# Patient Record
Sex: Female | Born: 1970 | Race: White | Hispanic: No | Smoking: Current every day smoker
Health system: Southern US, Community
[De-identification: ages and names within clinical notes are randomized; demographics above are authoritative.]

## PROBLEM LIST (undated history)

## (undated) DIAGNOSIS — E119 Type 2 diabetes mellitus without complications: Secondary | ICD-10-CM

## (undated) DIAGNOSIS — J45909 Unspecified asthma, uncomplicated: Secondary | ICD-10-CM

## (undated) DIAGNOSIS — J449 Chronic obstructive pulmonary disease, unspecified: Secondary | ICD-10-CM

## (undated) DIAGNOSIS — R7303 Prediabetes: Secondary | ICD-10-CM

## (undated) DIAGNOSIS — F32A Depression, unspecified: Secondary | ICD-10-CM

## (undated) HISTORY — DX: Chronic obstructive pulmonary disease, unspecified: J44.9

## (undated) HISTORY — DX: Type 2 diabetes mellitus without complications: E11.9

## (undated) HISTORY — DX: Unspecified asthma, uncomplicated: J45.909

## (undated) HISTORY — PX: HERNIA REPAIR: SHX51

## (undated) HISTORY — PX: CHOLECYSTECTOMY: SHX55

---

## 1998-09-25 ENCOUNTER — Emergency Department (HOSPITAL_COMMUNITY): Admission: EM | Admit: 1998-09-25 | Discharge: 1998-09-25 | Payer: Self-pay | Admitting: Emergency Medicine

## 1999-01-22 ENCOUNTER — Other Ambulatory Visit: Admission: RE | Admit: 1999-01-22 | Discharge: 1999-01-22 | Payer: Self-pay | Admitting: Obstetrics

## 1999-01-22 ENCOUNTER — Encounter (HOSPITAL_COMMUNITY): Admission: RE | Admit: 1999-01-22 | Discharge: 1999-04-22 | Payer: Self-pay | Admitting: Obstetrics

## 1999-01-23 ENCOUNTER — Other Ambulatory Visit: Admission: RE | Admit: 1999-01-23 | Discharge: 1999-01-23 | Payer: Self-pay | Admitting: Obstetrics

## 1999-01-23 ENCOUNTER — Encounter (INDEPENDENT_AMBULATORY_CARE_PROVIDER_SITE_OTHER): Payer: Self-pay

## 1999-04-29 ENCOUNTER — Inpatient Hospital Stay (HOSPITAL_COMMUNITY): Admission: AD | Admit: 1999-04-29 | Discharge: 1999-04-29 | Payer: Self-pay | Admitting: Obstetrics

## 1999-05-02 ENCOUNTER — Emergency Department (HOSPITAL_COMMUNITY): Admission: EM | Admit: 1999-05-02 | Discharge: 1999-05-02 | Payer: Self-pay | Admitting: Internal Medicine

## 1999-06-25 ENCOUNTER — Inpatient Hospital Stay (HOSPITAL_COMMUNITY): Admission: AD | Admit: 1999-06-25 | Discharge: 1999-06-25 | Payer: Self-pay | Admitting: Obstetrics

## 2000-08-23 ENCOUNTER — Emergency Department (HOSPITAL_COMMUNITY): Admission: EM | Admit: 2000-08-23 | Discharge: 2000-08-23 | Payer: Self-pay | Admitting: Emergency Medicine

## 2001-04-09 ENCOUNTER — Emergency Department (HOSPITAL_COMMUNITY): Admission: EM | Admit: 2001-04-09 | Discharge: 2001-04-09 | Payer: Self-pay | Admitting: Emergency Medicine

## 2001-11-15 ENCOUNTER — Encounter: Payer: Self-pay | Admitting: Emergency Medicine

## 2001-11-15 ENCOUNTER — Emergency Department (HOSPITAL_COMMUNITY): Admission: EM | Admit: 2001-11-15 | Discharge: 2001-11-15 | Payer: Self-pay | Admitting: Emergency Medicine

## 2002-03-01 ENCOUNTER — Other Ambulatory Visit: Admission: RE | Admit: 2002-03-01 | Discharge: 2002-03-01 | Payer: Self-pay | Admitting: Obstetrics and Gynecology

## 2003-06-07 ENCOUNTER — Encounter: Admission: RE | Admit: 2003-06-07 | Discharge: 2003-06-07 | Payer: Self-pay | Admitting: Otolaryngology

## 2003-07-23 ENCOUNTER — Emergency Department (HOSPITAL_COMMUNITY): Admission: EM | Admit: 2003-07-23 | Discharge: 2003-07-23 | Payer: Self-pay | Admitting: Emergency Medicine

## 2003-09-10 ENCOUNTER — Ambulatory Visit (HOSPITAL_COMMUNITY): Admission: RE | Admit: 2003-09-10 | Discharge: 2003-09-10 | Payer: Self-pay | Admitting: Otolaryngology

## 2003-09-10 ENCOUNTER — Ambulatory Visit (HOSPITAL_BASED_OUTPATIENT_CLINIC_OR_DEPARTMENT_OTHER): Admission: RE | Admit: 2003-09-10 | Discharge: 2003-09-10 | Payer: Self-pay | Admitting: Otolaryngology

## 2003-09-10 ENCOUNTER — Encounter (INDEPENDENT_AMBULATORY_CARE_PROVIDER_SITE_OTHER): Payer: Self-pay | Admitting: *Deleted

## 2004-06-15 ENCOUNTER — Emergency Department (HOSPITAL_COMMUNITY): Admission: EM | Admit: 2004-06-15 | Discharge: 2004-06-16 | Payer: Self-pay | Admitting: Emergency Medicine

## 2004-07-17 ENCOUNTER — Emergency Department (HOSPITAL_COMMUNITY): Admission: EM | Admit: 2004-07-17 | Discharge: 2004-07-17 | Payer: Self-pay | Admitting: Emergency Medicine

## 2005-12-28 ENCOUNTER — Emergency Department (HOSPITAL_COMMUNITY): Admission: EM | Admit: 2005-12-28 | Discharge: 2005-12-28 | Payer: Self-pay | Admitting: Emergency Medicine

## 2006-06-17 ENCOUNTER — Emergency Department (HOSPITAL_COMMUNITY): Admission: EM | Admit: 2006-06-17 | Discharge: 2006-06-18 | Payer: Self-pay | Admitting: Emergency Medicine

## 2007-07-26 ENCOUNTER — Emergency Department (HOSPITAL_COMMUNITY): Admission: EM | Admit: 2007-07-26 | Discharge: 2007-07-26 | Payer: Self-pay | Admitting: Emergency Medicine

## 2007-07-27 ENCOUNTER — Emergency Department (HOSPITAL_COMMUNITY): Admission: EM | Admit: 2007-07-27 | Discharge: 2007-07-27 | Payer: Self-pay | Admitting: Family Medicine

## 2008-01-19 ENCOUNTER — Emergency Department (HOSPITAL_COMMUNITY): Admission: EM | Admit: 2008-01-19 | Discharge: 2008-01-19 | Payer: Self-pay | Admitting: Emergency Medicine

## 2008-03-25 ENCOUNTER — Emergency Department (HOSPITAL_COMMUNITY): Admission: EM | Admit: 2008-03-25 | Discharge: 2008-03-25 | Payer: Self-pay | Admitting: Emergency Medicine

## 2008-04-03 ENCOUNTER — Emergency Department (HOSPITAL_COMMUNITY): Admission: EM | Admit: 2008-04-03 | Discharge: 2008-04-03 | Payer: Self-pay | Admitting: Family Medicine

## 2009-01-01 ENCOUNTER — Emergency Department (HOSPITAL_COMMUNITY): Admission: EM | Admit: 2009-01-01 | Discharge: 2009-01-01 | Payer: Self-pay | Admitting: Emergency Medicine

## 2009-10-05 ENCOUNTER — Emergency Department (HOSPITAL_COMMUNITY): Admission: EM | Admit: 2009-10-05 | Discharge: 2009-10-05 | Payer: Self-pay | Admitting: Emergency Medicine

## 2010-10-24 NOTE — Op Note (Signed)
NAME:  Susan Trevino, Susan Trevino                        ACCOUNT NO.:  [id number]   MEDICAL RECORD NO.:  [id number]                   PATIENT TYPE:  AMB   LOCATION:  DSC                                  FACILITY:  [facility]   PHYSICIAN:  Jefry H. Pollyann Kennedy, M.D.                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   PROCEDURE:  Tympanoplasty, mastoidectomy with ossicular reconstruction using  a PORP.   SURGEON:  Jefry H. Pollyann Kennedy, M.D.   ANESTHESIA:  General endotracheal anesthesia.   COMPLICATIONS:  None.   ESTIMATED BLOOD LOSS:  100 mL.   FINDINGS:  Mastoid air cells sclerosed with chronic granulation tissue and  fluid within the mastoid system.  Severe edema and granulation of the middle  ear mucosa with ingrowth of epithelium onto the promontory and surrounding  the stapes super structure and oval window.  The incus was completely  absent.  The malleolus was in normal state.  The external genu of the facial  nerve was completely dehiscent.   HISTORY:  This is a 40 year old lady with a life long history of chronic ear  infections and a multi-year history of chronic drainage from the left ear.  The risks, benefits, alternatives, and complications of the procedure were  explained to the patient who seemed to understand and agreed to the surgery.   PROCEDURE:  The patient was taken to the operating room and placed on the  operating table in supine position.   Following induction of general  endotracheal anesthesia, the left ear was prepped and draped in standard  fashion.  1% Xylocaine with epinephrine was infiltrated into four quadrants  of the external auditory canal and into the postauricular sulcus.  A  vascular strip was created by incising the tympanosquamous and  tympanomastoid suture line skin connecting the two incisions with the round  knife and dissecting off a vascular strip from the ear canal bone.  The  postauricular sulcus was incised down to the linear temporalis.  The ear was  brought forward.  Loose areolar tissue was harvested from the temporalis  fascial area, prepped, and dried on the back table.  The linear temporalis  was incised as was the mastoid periosteum.  The periosteum was brought  forward with the ear and secured in place with the [facility] retractor.  The  saccular strip was dissected off the ear canal along with the pinna.  Using  the operating microscope, the tympanic membrane was then inspected.  Careful  dissection off  the ear canal and the drum was brought forward.  I was not  able to visualize the depth of the retraction and, therefore, decided to  perform a mastoidectomy.  A simple canal wall up mastoidectomy was  performed.  Various size cutting burs with high speed drill with suction  irrigation was used to open the mastoid cortex.  The sigmoid sinus was  anteriorly placed creating a narrow mastoid sinus.  The sigmoid was kept  intact.  The superior limit of dissection contained very thick bone.  The  tegmen was kept intact.  The sinodural angle was cleaned out.  The mastoid  tip was cleaned out of severe granulation tissue and some serous fluid.  The  posterior canal wall was thin.  The fossa incudis was opened, there was no  incus present.  There was no cholesteatoma within the mastoid process.  The  lateral semicircular canal was identified and kept intact.  The other canals  were not identified as the  disease did not extend that deep.  A deep  atticotomy was performed using a very small bur on a low speed.  The facial  nerve was identified and was dehiscent.  The remainder of the middle ear  dissection was then accomplished having the facial nerve in full view.  The  stapes super structure was identified and granulation tissue, edematous  mucosa, and epithelial tissue was dissected out of the oval window nitch.  The promontory was cleaned of severely edematous overlying tissue as was the  anterior tympanic cavity up by the eustachian tube.  The tissue was  dissected off the malleolus as was the tympanic membrane remnant.  The  malleolus was dissected clean.  Abnormal tissue was removed from all areas  of the middle ear.  The middle ear was packed with Gelfoam soaked in  Ciprodex.  The graft was trimmed, cut with a notch for the malleolus, and  placed underlying the tympanic membrane and underlying the malleolus.  A  Goldenberg 4.2 mm PORP incus replacement notched for the malleolus was cut  down to size approximately 3 mm and placed in good position on the stapes  super structure with the notch encased underneath the malleolus handle.  The  drum and graft were then placed into their normal position and the middle  ear was further packed with more Gelfoam.  The ear canal was then packed, as  well.  The mastoid cavity was irrigated and packed with Gelfoam soaked in  Ciprodex.  The postauricular incision was reapproximated with subcutaneous  chromic suture.  Benzoin and Steri-Strips were applied.  The ear canal was  re-examined and the vascular strip was laid down nicely against the  posterior canal wall.  The external meatus was packed with Bacitracin and a  cotton ball.  A mastoid dressing was applied.  The patient was then  awakened, extubated, and transferred to the recovery room in stable  condition.                                               Jefry H. Pollyann Kennedy, M.D.   JHR/MEDQ   D:  09/10/2003  T:  09/10/2003  Job:  [id number]

## 2011-11-17 ENCOUNTER — Encounter (HOSPITAL_COMMUNITY): Payer: Self-pay | Admitting: *Deleted

## 2011-11-17 ENCOUNTER — Emergency Department (HOSPITAL_COMMUNITY)
Admission: EM | Admit: 2011-11-17 | Discharge: 2011-11-17 | Disposition: A | Discharge status: Home / Self Care | Payer: Self-pay | Attending: Emergency Medicine | Admitting: Emergency Medicine

## 2011-11-17 DIAGNOSIS — W19XXXA Unspecified fall, initial encounter: Secondary | ICD-10-CM | POA: Insufficient documentation

## 2011-11-17 DIAGNOSIS — Z79899 Other long term (current) drug therapy: Secondary | ICD-10-CM | POA: Insufficient documentation

## 2011-11-17 DIAGNOSIS — M25559 Pain in unspecified hip: Secondary | ICD-10-CM | POA: Insufficient documentation

## 2011-11-17 DIAGNOSIS — T23209A Burn of second degree of unspecified hand, unspecified site, initial encounter: Secondary | ICD-10-CM

## 2011-11-17 DIAGNOSIS — M25539 Pain in unspecified wrist: Secondary | ICD-10-CM | POA: Insufficient documentation

## 2011-11-17 DIAGNOSIS — R42 Dizziness and giddiness: Secondary | ICD-10-CM | POA: Insufficient documentation

## 2011-11-17 DIAGNOSIS — K219 Gastro-esophageal reflux disease without esophagitis: Secondary | ICD-10-CM | POA: Insufficient documentation

## 2011-11-17 DIAGNOSIS — G8929 Other chronic pain: Secondary | ICD-10-CM | POA: Insufficient documentation

## 2011-11-17 DIAGNOSIS — IMO0001 Reserved for inherently not codable concepts without codable children: Secondary | ICD-10-CM | POA: Insufficient documentation

## 2011-11-17 DIAGNOSIS — M25529 Pain in unspecified elbow: Secondary | ICD-10-CM | POA: Insufficient documentation

## 2011-11-17 MED ORDER — HYDROCODONE-ACETAMINOPHEN 5-325 MG PO TABS
2.0000 | ORAL_TABLET | Freq: Once | ORAL | Status: AC
  Administered 2011-11-17: 2 via ORAL
  Filled 2011-11-17: qty 2

## 2011-11-17 MED ORDER — HYDROCODONE-ACETAMINOPHEN 5-325 MG PO TABS: 1.0000 | ORAL_TABLET | Freq: Four times a day (QID) | ORAL | Status: AC | PRN
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
## 2011-11-20 NOTE — ED Provider Notes (Signed)
Medical screening examination/treatment/procedure(s) were performed by non-physician practitioner and as supervising physician I was immediately available for consultation/collaboration.  Taura Lamarre R. Jenifer Struve, MD 11/20/11 0733 

## 2014-05-05 ENCOUNTER — Encounter (HOSPITAL_COMMUNITY): Payer: Self-pay | Admitting: *Deleted

## 2014-05-05 ENCOUNTER — Emergency Department (HOSPITAL_COMMUNITY): Payer: Self-pay

## 2014-05-05 ENCOUNTER — Emergency Department (HOSPITAL_COMMUNITY)
Admission: EM | Admit: 2014-05-05 | Discharge: 2014-05-05 | Disposition: A | Discharge status: Home / Self Care | Payer: Self-pay | Attending: Emergency Medicine | Admitting: Emergency Medicine

## 2014-05-05 DIAGNOSIS — Z3202 Encounter for pregnancy test, result negative: Secondary | ICD-10-CM | POA: Insufficient documentation

## 2014-05-05 DIAGNOSIS — Z720 Tobacco use: Secondary | ICD-10-CM | POA: Insufficient documentation

## 2014-05-05 DIAGNOSIS — Z9049 Acquired absence of other specified parts of digestive tract: Secondary | ICD-10-CM | POA: Insufficient documentation

## 2014-05-05 DIAGNOSIS — R109 Unspecified abdominal pain: Secondary | ICD-10-CM

## 2014-05-05 DIAGNOSIS — R1011 Right upper quadrant pain: Secondary | ICD-10-CM | POA: Insufficient documentation

## 2014-05-05 LAB — URINALYSIS, ROUTINE W REFLEX MICROSCOPIC
Bilirubin Urine: NEGATIVE
Glucose, UA: NEGATIVE mg/dL
Hgb urine dipstick: NEGATIVE
Ketones, ur: NEGATIVE mg/dL
Leukocytes, UA: NEGATIVE
Nitrite: POSITIVE — AB
Protein, ur: NEGATIVE mg/dL
Specific Gravity, Urine: 1.013 (ref 1.005–1.030)
Urobilinogen, UA: 0.2 mg/dL (ref 0.0–1.0)
pH: 5.5 (ref 5.0–8.0)

## 2014-05-05 LAB — CBC WITH DIFFERENTIAL/PLATELET
Basophils Absolute: 0 10*3/uL (ref 0.0–0.1)
Basophils Relative: 0 % (ref 0–1)
Eosinophils Absolute: 0.5 10*3/uL (ref 0.0–0.7)
Eosinophils Relative: 6 % — ABNORMAL HIGH (ref 0–5)
HCT: 40.2 % (ref 36.0–46.0)
Hemoglobin: 13.3 g/dL (ref 12.0–15.0)
Lymphocytes Relative: 40 % (ref 12–46)
Lymphs Abs: 3.3 10*3/uL (ref 0.7–4.0)
MCH: 30.6 pg (ref 26.0–34.0)
MCHC: 33.1 g/dL (ref 30.0–36.0)
MCV: 92.4 fL (ref 78.0–100.0)
Monocytes Absolute: 0.6 10*3/uL (ref 0.1–1.0)
Monocytes Relative: 7 % (ref 3–12)
Neutro Abs: 3.9 10*3/uL (ref 1.7–7.7)
Neutrophils Relative %: 47 % (ref 43–77)
Platelets: 252 10*3/uL (ref 150–400)
RBC: 4.35 MIL/uL (ref 3.87–5.11)
RDW: 13.5 % (ref 11.5–15.5)
WBC: 8.3 10*3/uL (ref 4.0–10.5)

## 2014-05-05 LAB — POC URINE PREG, ED: Preg Test, Ur: NEGATIVE

## 2014-05-05 LAB — COMPREHENSIVE METABOLIC PANEL
ALT: 41 U/L — ABNORMAL HIGH (ref 0–35)
AST: 31 U/L (ref 0–37)
Albumin: 3.6 g/dL (ref 3.5–5.2)
Alkaline Phosphatase: 135 U/L — ABNORMAL HIGH (ref 39–117)
Anion gap: 12 (ref 5–15)
BUN: 12 mg/dL (ref 6–23)
CO2: 24 mEq/L (ref 19–32)
Calcium: 9.2 mg/dL (ref 8.4–10.5)
Chloride: 104 mEq/L (ref 96–112)
Creatinine, Ser: 0.84 mg/dL (ref 0.50–1.10)
GFR calc Af Amer: >90 mL/min (ref >90)
GFR calc non Af Amer: 83 mL/min — ABNORMAL LOW (ref >90)
Glucose, Bld: 98 mg/dL (ref 70–99)
Potassium: 4.2 mEq/L (ref 3.7–5.3)
Sodium: 140 mEq/L (ref 137–147)
Total Bilirubin: 0.2 mg/dL — ABNORMAL LOW (ref 0.3–1.2)
Total Protein: 7 g/dL (ref 6.0–8.3)

## 2014-05-05 LAB — URINE MICROSCOPIC-ADD ON

## 2014-05-05 LAB — LIPASE, BLOOD: Lipase: 34 U/L (ref 11–59)

## 2014-05-05 MED ORDER — ONDANSETRON 8 MG PO TBDP: 8.0000 mg | ORAL_TABLET | Freq: Three times a day (TID) | ORAL | Status: DC | PRN

## 2014-05-05 MED ORDER — OXYCODONE-ACETAMINOPHEN 5-325 MG PO TABS
2.0000 | ORAL_TABLET | Freq: Once | ORAL | Status: AC
  Administered 2014-05-05: 2 via ORAL
  Filled 2014-05-05: qty 2

## 2014-05-05 MED ORDER — OXYCODONE-ACETAMINOPHEN 5-325 MG PO TABS: 2.0000 | ORAL_TABLET | ORAL | Status: DC | PRN

## 2014-05-05 NOTE — ED Notes (Addendum)
Pt reports abdominal pain since yesterday, sts had gallbladder removed in May, didn't have any problems since then, reports constipation and nausea, LBM 2 days ago. Pt has hiatal hernia

## 2014-05-05 NOTE — Discharge Instructions (Signed)
Abdominal Pain, Women °Abdominal (stomach, pelvic, or belly) pain can be caused by many things. It is important to tell your doctor: °· The location of the pain. °· Does it come and go or is it present all the time? °· Are there things that start the pain (eating certain foods, exercise)? °· Are there other symptoms associated with the pain (fever, nausea, vomiting, diarrhea)? °All of this is helpful to know when trying to find the cause of the pain. °CAUSES  °· Stomach: virus or bacteria infection, or ulcer. °· Intestine: appendicitis (inflamed appendix), regional ileitis (Crohn's disease), ulcerative colitis (inflamed colon), irritable bowel syndrome, diverticulitis (inflamed diverticulum of the colon), or cancer of the stomach or intestine. °· Gallbladder disease or stones in the gallbladder. °· Kidney disease, kidney stones, or infection. °· Pancreas infection or cancer. °· Fibromyalgia (pain disorder). °· Diseases of the female organs: °¨ Uterus: fibroid (non-cancerous) tumors or infection. °¨ Fallopian tubes: infection or tubal pregnancy. °¨ Ovary: cysts or tumors. °¨ Pelvic adhesions (scar tissue). °¨ Endometriosis (uterus lining tissue growing in the pelvis and on the pelvic organs). °¨ Pelvic congestion syndrome (female organs filling up with blood just before the menstrual period). °¨ Pain with the menstrual period. °¨ Pain with ovulation (producing an egg). °¨ Pain with an IUD (intrauterine device, birth control) in the uterus. °¨ Cancer of the female organs. °· Functional pain (pain not caused by a disease, may improve without treatment). °· Psychological pain. °· Depression. °DIAGNOSIS  °Your doctor will decide the seriousness of your pain by doing an examination. °· Blood tests. °· X-rays. °· Ultrasound. °· CT scan (computed tomography, special type of X-Vint Pola). °· MRI (magnetic resonance imaging). °· Cultures, for infection. °· Barium enema (dye inserted in the large intestine, to better view it with  X-rays). °· Colonoscopy (looking in intestine with a lighted tube). °· Laparoscopy (minor surgery, looking in abdomen with a lighted tube). °· Major abdominal exploratory surgery (looking in abdomen with a large incision). °TREATMENT  °The treatment will depend on the cause of the pain.  °· Many cases can be observed and treated at home. °· Over-the-counter medicines recommended by your caregiver. °· Prescription medicine. °· Antibiotics, for infection. °· Birth control pills, for painful periods or for ovulation pain. °· Hormone treatment, for endometriosis. °· Nerve blocking injections. °· Physical therapy. °· Antidepressants. °· Counseling with a psychologist or psychiatrist. °· Minor or major surgery. °HOME CARE INSTRUCTIONS  °· Do not take laxatives, unless directed by your caregiver. °· Take over-the-counter pain medicine only if ordered by your caregiver. Do not take aspirin because it can cause an upset stomach or bleeding. °· Try a clear liquid diet (broth or water) as ordered by your caregiver. Slowly move to a bland diet, as tolerated, if the pain is related to the stomach or intestine. °· Have a thermometer and take your temperature several times a day, and record it. °· Bed rest and sleep, if it helps the pain. °· Avoid sexual intercourse, if it causes pain. °· Avoid stressful situations. °· Keep your follow-up appointments and tests, as your caregiver orders. °· If the pain does not go away with medicine or surgery, you may try: °¨ Acupuncture. °¨ Relaxation exercises (yoga, meditation). °¨ Group therapy. °¨ Counseling. °SEEK MEDICAL CARE IF:  °· You notice certain foods cause stomach pain. °· Your home care treatment is not helping your pain. °· You need stronger pain medicine. °· You want your IUD removed. °· You feel faint or   lightheaded. °· You develop nausea and vomiting. °· You develop a rash. °· You are having side effects or an allergy to your medicine. °SEEK IMMEDIATE MEDICAL CARE IF:  °· Your  pain does not go away or gets worse. °· You have a fever. °· Your pain is felt only in portions of the abdomen. The right side could possibly be appendicitis. The left lower portion of the abdomen could be colitis or diverticulitis. °· You are passing blood in your stools (bright red or black tarry stools, with or without vomiting). °· You have blood in your urine. °· You develop chills, with or without a fever. °· You pass out. °MAKE SURE YOU:  °· Understand these instructions. °· Will watch your condition. °· Will get help right away if you are not doing well or get worse. °Document Released: 03/22/2007 Document Revised: 10/09/2013 Document Reviewed: 04/11/2009 °ExitCare® Patient Information ©2015 [employer]. This information is not intended to replace advice given to you by your health care provider. Make sure you discuss any questions you have with your health care provider. ° °

## 2014-05-05 NOTE — ED Provider Notes (Signed)
CSN: [id number]     Arrival date & time 05/05/14  1804 History   First MD Initiated Contact with Patient 05/05/14 2015     Chief Complaint  Patient presents with  . Abdominal Pain     (Consider location/radiation/quality/duration/timing/severity/associated sxs/prior Treatment) HPI 43 year old female complaining of some intermittent sharp right upper abdominal pain for 2 days. The pain is worse with movement, coughing, and deep breath. She states that she had her gallbladder removed in May. She does not have any associated nausea, vomiting, fever, chills, UTI symptoms, or loss of appetite. She feels she has a bulge in this area. History reviewed. No pertinent past medical history. History reviewed. No pertinent past surgical history. No family history on file. History  Substance Use Topics  . Smoking status: Current Every Day Smoker  . Smokeless tobacco: Not on file  . Alcohol Use: No   OB History    No data available     Review of Systems  All other systems reviewed and are negative.     Allergies  Review of patient's allergies indicates no known allergies.  Home Medications   Prior to Admission medications   Medication Sig Start Date End Date Taking? Authorizing Provider  diphenhydrAMINE (BENADRYL) 25 MG tablet Take 50 mg by mouth every 6 (six) hours as needed for itching or allergies (allergic reaction).   Yes Historical Provider, MD   BP 103/53 mmHg  Pulse 70  Temp(Src) 98.8 F (37.1 C) (Oral)  Resp 18  SpO2 97% Physical Exam  Constitutional: She is oriented to person, place, and time. She appears well-developed and well-nourished.  Morbidly obese  HENT:  Head: Normocephalic and atraumatic.  Right Ear: External ear normal.  Left Ear: External ear normal.  Nose: Nose normal.  Mouth/Throat: Oropharynx is clear and moist.  Eyes: Conjunctivae and EOM are normal. Pupils are equal, round, and reactive to light.  Neck: Normal range of motion. Neck supple. No JVD  present. No tracheal deviation present. No thyromegaly present.  Cardiovascular: Normal rate, regular rhythm, normal heart sounds and intact distal pulses.   Pulmonary/Chest: Effort normal and breath sounds normal. She has no wheezes.  Abdominal: Soft. Bowel sounds are normal. She exhibits no mass. There is tenderness. There is no guarding.  Mild tenderness palpation right upper quadrant that appears to be more in the abdominal wall masses are noted  Musculoskeletal: Normal range of motion.  Lymphadenopathy:    She has no cervical adenopathy.  Neurological: She is alert and oriented to person, place, and time. She has normal reflexes. No cranial nerve deficit or sensory deficit. Gait normal. GCS eye subscore is 4. GCS verbal subscore is 5. GCS motor subscore is 6.  Reflex Scores:      Bicep reflexes are 2+ on the right side and 2+ on the left side.      Patellar reflexes are 2+ on the right side and 2+ on the left side. Strength is 5/5 bilateral elbow flexor/extensors, wrist extension/flexion, intrinsic hand strength equal Bilateral hip flexion/extension 5/5, knee flexion/extension 5/5, ankle 5/5 flexion extension    Skin: Skin is warm and dry.  Psychiatric: She has a normal mood and affect. Her behavior is normal. Judgment and thought content normal.  Nursing note and vitals reviewed.   ED Course  Procedures (including critical care time) Labs Review Labs Reviewed  CBC WITH DIFFERENTIAL - Abnormal; Notable for the following:    Eosinophils Relative 6 (*)    All other components within normal limits  COMPREHENSIVE METABOLIC PANEL - Abnormal; Notable for the following:    ALT 41 (*)    Alkaline Phosphatase 135 (*)    Total Bilirubin 0.2 (*)    GFR calc non Af Amer 83 (*)    All other components within normal limits  URINALYSIS, ROUTINE W REFLEX MICROSCOPIC - Abnormal; Notable for the following:    Nitrite POSITIVE (*)    All other components within normal limits  URINE  MICROSCOPIC-ADD ON - Abnormal; Notable for the following:    Bacteria, UA MANY (*)    All other components within normal limits  LIPASE, BLOOD  POC URINE PREG, ED    Imaging Review Dg Abd 1 View  05/05/2014   CLINICAL DATA:  Subacute onset of generalized abdominal pain for 1 month, worsening for the past day. Nausea and constipation. Initial encounter.  EXAM: ABDOMEN - 1 VIEW  COMPARISON:  CT of the abdomen and pelvis from 07/24/2013, and pelvis radiograph from 05/24/2010  FINDINGS: The visualized bowel gas pattern is unremarkable. Scattered air and stool filled loops of colon are seen; no abnormal dilatation of small bowel loops is seen to suggest small bowel obstruction. No free intra-abdominal air is identified, though evaluation for free air is limited on a single supine view. Clips are noted within the right upper quadrant, reflecting prior cholecystectomy. A clip is also noted at the upper pelvis.  The visualized osseous structures are within normal limits; the sacroiliac joints are unremarkable in appearance.  IMPRESSION: Unremarkable bowel gas pattern; no free intra-abdominal air seen. Moderate amount of stool noted in the colon.   Electronically Signed   By: Roanna RaiderJeffery  Chang M.D.   On: 05/05/2014 21:14     EKG Interpretation None      MDM   Final diagnoses:  Abdominal pain    43 year old female with some sharp right upper quadrant pain. Pain appears to be in the right upper quadrant. She has normal labs and normal plain x-Lynze Reddy. Urine is nitrite positive but has only 0-2 white blood cells. She does not have any symptoms of urinary tract action I suspect that this is a contaminated clean catch specimen. Patient is advised regarding follow-up and return precautions and voices understanding.    Hilario Quarryanielle S Ogle Hoeffner, MD 05/05/14 2141

## 2014-07-22 ENCOUNTER — Encounter (HOSPITAL_COMMUNITY): Payer: Self-pay | Admitting: Physical Medicine and Rehabilitation

## 2014-07-22 ENCOUNTER — Emergency Department (HOSPITAL_COMMUNITY)
Admission: EM | Admit: 2014-07-22 | Discharge: 2014-07-22 | Disposition: A | Discharge status: Home / Self Care | Payer: Self-pay | Attending: Emergency Medicine | Admitting: Emergency Medicine

## 2014-07-22 DIAGNOSIS — L309 Dermatitis, unspecified: Secondary | ICD-10-CM | POA: Insufficient documentation

## 2014-07-22 DIAGNOSIS — Z720 Tobacco use: Secondary | ICD-10-CM | POA: Insufficient documentation

## 2014-07-22 MED ORDER — HYDROXYZINE HCL 25 MG PO TABS: 25.0000 mg | ORAL_TABLET | Freq: Four times a day (QID) | ORAL | Status: DC

## 2014-07-22 MED ORDER — METHYLPREDNISOLONE SODIUM SUCC 125 MG IJ SOLR
125.0000 mg | Freq: Once | INTRAMUSCULAR | Status: AC
  Administered 2014-07-22: 125 mg via INTRAMUSCULAR
  Filled 2014-07-22: qty 2

## 2014-07-22 MED ORDER — PREDNISONE 10 MG PO TABS: ORAL_TABLET | ORAL | Status: DC

## 2014-07-22 NOTE — ED Provider Notes (Signed)
CSN: [id number]     Arrival date & time 07/22/14  1146 History  This chart was scribed for non-physician practitioner, Elson AreasLeslie K Shaneal Barasch, PA-C, working with Linwood DibblesJon Knapp, MD, by Lionel DecemberHatice Demirci, ED Scribe. This patient was seen in room TR11C/TR11C and the patient's care was started at 12:44 PM.    Chief Complaint  Patient presents with  . Rash     (Consider location/radiation/quality/duration/timing/severity/associated sxs/prior Treatment) HPI  HPI Comments: Susan Trevino is a 44 y.o. female who presents to the Emergency Department complaining of a generalized rash all over her body and itching which has been going on for the last several months. She states that she has been intensely scratching her scabs and it seems to get worse.  Patient states that she was staying in a place with bed begs which caused her initial itching. Patient notes she got lotion (which she cannot recall) but states that it did not alleviate her symptoms. Patient has no other complaints today.      History reviewed. No pertinent past medical history. History reviewed. No pertinent past surgical history. No family history on file. History  Substance Use Topics  . Smoking status: Current Every Day Smoker    Types: Cigarettes  . Smokeless tobacco: Not on file  . Alcohol Use: No   OB History    No data available     Review of Systems  Constitutional: Negative for fever and chills.  HENT: Negative for congestion and nosebleeds.   Respiratory: Negative for chest tightness.   Cardiovascular: Negative for chest pain.  Skin: Positive for color change and rash.      Allergies  Review of patient's allergies indicates no known allergies.  Home Medications   Prior to Admission medications   Medication Sig Start Date End Date Taking? Authorizing Provider  ondansetron (ZOFRAN ODT) 8 MG disintegrating tablet Take 1 tablet (8 mg total) by mouth every 8 (eight) hours as needed for nausea or vomiting. Patient not  taking: Reported on 07/22/2014 05/05/14   Hilario Quarryanielle S Ray, MD  oxyCODONE-acetaminophen (PERCOCET/ROXICET) 5-325 MG per tablet Take 2 tablets by mouth every 4 (four) hours as needed for moderate pain or severe pain. Patient not taking: Reported on 07/22/2014 05/05/14   Hilario Quarryanielle S Ray, MD   BP 145/68 mmHg  Pulse 74  Temp(Src) 98 F (36.7 C) (Oral)  Resp 18  SpO2 99% Physical Exam  Constitutional: She is oriented to person, place, and time. She appears well-developed and well-nourished. No distress.  HENT:  Head: Normocephalic and atraumatic.  Eyes: Conjunctivae and EOM are normal.  Neck: Neck supple.  Cardiovascular: Normal rate.   Pulmonary/Chest: Effort normal. No respiratory distress.  Musculoskeletal: Normal range of motion.  Neurological: She is alert and oriented to person, place, and time.  Skin: Rash noted. There is erythema.  raised erythematous rash Multiple cracks to skin and arms.   Psychiatric: She has a normal mood and affect. Her behavior is normal.  Nursing note and vitals reviewed.   ED Course  Procedures (including critical care time) DIAGNOSTIC STUDIES: Oxygen Saturation is 99% on RA, normal by my interpretation.    COORDINATION OF CARE: 12:50 PM Discussed treatment plan with patient at beside, the patient agrees with the plan and has no further questions at this time.   Labs Review Labs Reviewed - No data to display  Imaging Review No results found.   EKG Interpretation None      MDM   Final diagnoses:  Eczema  Meds ordered this encounter  Medications  . methylPREDNISolone sodium succinate (SOLU-MEDROL) 125 mg/2 mL injection 125 mg    Sig:   . predniSONE (DELTASONE) 10 MG tablet    Sig: 6,5,4,3,2,1 taper    Dispense:  21 tablet    Refill:  0    Order Specific Question:  Supervising Provider    Answer:  Eber Hong D [[id number]]  . hydrOXYzine (ATARAX/VISTARIL) 25 MG tablet    Sig: Take 1 tablet (25 mg total) by mouth every 6 (six) hours.     Dispense:  12 tablet    Refill:  0    Order Specific Question:  Supervising Provider    Answer:  Eber Hong D [[id number]]    I personally performed the services in this documentation, which was scribed in my presence.  The recorded information has been reviewed and considered.   Barnet Pall.  Lonia Skinner [place], PA-C 07/22/14 1653  Linwood Dibbles, MD 07/24/14 [phone]

## 2014-07-22 NOTE — ED Notes (Signed)
Pt reports rash and itching all over body. Ongoing for several months. Respirations unlabored. Pt is alert and oriented x4.

## 2014-07-22 NOTE — Discharge Instructions (Signed)
Eczema Eczema, also called atopic dermatitis, is a skin disorder that causes inflammation of the skin. It causes a red rash and dry, scaly skin. The skin becomes very itchy. Eczema is generally worse during the cooler winter months and often improves with the warmth of summer. Eczema usually starts showing signs in infancy. Some children outgrow eczema, but it may last through adulthood.  CAUSES  The exact cause of eczema is not known, but it appears to run in families. People with eczema often have a family history of eczema, allergies, asthma, or hay fever. Eczema is not contagious. Flare-ups of the condition may be caused by:   Contact with something you are sensitive or allergic to.   Stress. SIGNS AND SYMPTOMS  Dry, scaly skin.   Red, itchy rash.   Itchiness. This may occur before the skin rash and may be very intense.  DIAGNOSIS  The diagnosis of eczema is usually made based on symptoms and medical history. TREATMENT  Eczema cannot be cured, but symptoms usually can be controlled with treatment and other strategies. A treatment plan might include:  Controlling the itching and scratching.   Use over-the-counter antihistamines as directed for itching. This is especially useful at night when the itching tends to be worse.   Use over-the-counter steroid creams as directed for itching.   Avoid scratching. Scratching makes the rash and itching worse. It may also result in a skin infection (impetigo) due to a break in the skin caused by scratching.   Keeping the skin well moisturized with creams every day. This will seal in moisture and help prevent dryness. Lotions that contain alcohol and water should be avoided because they can dry the skin.   Limiting exposure to things that you are sensitive or allergic to (allergens).   Recognizing situations that cause stress.   Developing a plan to manage stress.  HOME CARE INSTRUCTIONS   Only take over-the-counter or  prescription medicines as directed by your health care provider.   Do not use anything on the skin without checking with your health care provider.   Keep baths or showers short (5 minutes) in warm (not hot) water. Use mild cleansers for bathing. These should be unscented. You may add nonperfumed bath oil to the bath water. It is best to avoid soap and bubble bath.   Immediately after a bath or shower, when the skin is still damp, apply a moisturizing ointment to the entire body. This ointment should be a petroleum ointment. This will seal in moisture and help prevent dryness. The thicker the ointment, the better. These should be unscented.   Keep fingernails cut short. Children with eczema may need to wear soft gloves or mittens at night after applying an ointment.   Dress in clothes made of cotton or cotton blends. Dress lightly, because heat increases itching.   A child with eczema should stay away from anyone with fever blisters or cold sores. The virus that causes fever blisters (herpes simplex) can cause a serious skin infection in children with eczema. SEEK MEDICAL CARE IF:   Your itching interferes with sleep.   Your rash gets worse or is not better within 1 week after starting treatment.   You see pus or soft yellow scabs in the rash area.   You have a fever.   You have a rash flare-up after contact with someone who has fever blisters.  Document Released: 05/22/2000 Document Revised: 03/15/2013 Document Reviewed: 12/26/2012 [employer] Patient Information 2015 [employer]. This information   is not intended to replace advice given to you by your health care provider. Make sure you discuss any questions you have with your health care provider.  

## 2014-07-22 NOTE — ED Notes (Signed)
Declined W/C at D/C and was escorted to lobby by RN. 

## 2014-08-04 ENCOUNTER — Emergency Department (HOSPITAL_COMMUNITY): Payer: Self-pay

## 2014-08-04 ENCOUNTER — Encounter (HOSPITAL_COMMUNITY): Payer: Self-pay | Admitting: Emergency Medicine

## 2014-08-04 ENCOUNTER — Emergency Department (HOSPITAL_COMMUNITY)
Admission: EM | Admit: 2014-08-04 | Discharge: 2014-08-04 | Disposition: A | Discharge status: Home / Self Care | Payer: Self-pay | Attending: Emergency Medicine | Admitting: Emergency Medicine

## 2014-08-04 DIAGNOSIS — R059 Cough, unspecified: Secondary | ICD-10-CM

## 2014-08-04 DIAGNOSIS — Z791 Long term (current) use of non-steroidal anti-inflammatories (NSAID): Secondary | ICD-10-CM | POA: Insufficient documentation

## 2014-08-04 DIAGNOSIS — Z720 Tobacco use: Secondary | ICD-10-CM | POA: Insufficient documentation

## 2014-08-04 DIAGNOSIS — B349 Viral infection, unspecified: Secondary | ICD-10-CM | POA: Insufficient documentation

## 2014-08-04 DIAGNOSIS — R05 Cough: Secondary | ICD-10-CM

## 2014-08-04 LAB — COMPREHENSIVE METABOLIC PANEL
ALBUMIN: 3.4 g/dL — AB (ref 3.5–5.2)
ALK PHOS: 130 U/L — AB (ref 39–117)
ALT: 909 U/L — AB (ref 0–35)
ANION GAP: 8 (ref 5–15)
AST: 937 U/L — ABNORMAL HIGH (ref 0–37)
BILIRUBIN TOTAL: 0.9 mg/dL (ref 0.3–1.2)
BUN: 10 mg/dL (ref 6–23)
CALCIUM: 8.1 mg/dL — AB (ref 8.4–10.5)
CHLORIDE: 104 mmol/L (ref 96–112)
CO2: 19 mmol/L (ref 19–32)
CREATININE: 1.02 mg/dL (ref 0.50–1.10)
GFR, EST AFRICAN AMERICAN: 76 mL/min — AB (ref >90)
GFR, EST NON AFRICAN AMERICAN: 66 mL/min — AB (ref >90)
Glucose, Bld: 109 mg/dL — ABNORMAL HIGH (ref 70–99)
POTASSIUM: 3.6 mmol/L (ref 3.5–5.1)
Sodium: 131 mmol/L — ABNORMAL LOW (ref 135–145)
TOTAL PROTEIN: 6.6 g/dL (ref 6.0–8.3)

## 2014-08-04 LAB — CBC WITH DIFFERENTIAL/PLATELET
Basophils Absolute: 0.1 10*3/uL (ref 0.0–0.1)
Basophils Relative: 1 % (ref 0–1)
Eosinophils Absolute: 0.4 10*3/uL (ref 0.0–0.7)
Eosinophils Relative: 6 % — ABNORMAL HIGH (ref 0–5)
HCT: 41.5 % (ref 36.0–46.0)
Hemoglobin: 13.8 g/dL (ref 12.0–15.0)
Lymphocytes Relative: 22 % (ref 12–46)
Lymphs Abs: 1.8 10*3/uL (ref 0.7–4.0)
MCH: 30.3 pg (ref 26.0–34.0)
MCHC: 33.3 g/dL (ref 30.0–36.0)
MCV: 91 fL (ref 78.0–100.0)
Monocytes Absolute: 0.6 10*3/uL (ref 0.1–1.0)
Monocytes Relative: 8 % (ref 3–12)
Neutro Abs: 5.2 10*3/uL (ref 1.7–7.7)
Neutrophils Relative %: 63 % (ref 43–77)
Platelets: 130 10*3/uL — ABNORMAL LOW (ref 150–400)
RBC: 4.56 MIL/uL (ref 3.87–5.11)
RDW: 13.7 % (ref 11.5–15.5)
WBC: 8 10*3/uL (ref 4.0–10.5)

## 2014-08-04 MED ORDER — KETOROLAC TROMETHAMINE 30 MG/ML IJ SOLN
30.0000 mg | Freq: Once | INTRAMUSCULAR | Status: AC
  Administered 2014-08-04: 30 mg via INTRAVENOUS
  Filled 2014-08-04: qty 1

## 2014-08-04 MED ORDER — SODIUM CHLORIDE 0.9 % IV BOLUS (SEPSIS)
1000.0000 mL | Freq: Once | INTRAVENOUS | Status: AC
  Administered 2014-08-04: 1000 mL via INTRAVENOUS

## 2014-08-04 MED ORDER — ONDANSETRON HCL 4 MG/2ML IJ SOLN
4.0000 mg | Freq: Once | INTRAMUSCULAR | Status: AC
  Administered 2014-08-04: 4 mg via INTRAVENOUS
  Filled 2014-08-04: qty 2

## 2014-08-04 MED ORDER — NAPROXEN 500 MG PO TABS: ORAL_TABLET | ORAL | Status: DC

## 2014-08-04 NOTE — ED Notes (Signed)
Pt c/o body aches, headache, abdominal hernia, and fever. Pt states at 0700 today her fever was 103.7, pt took 4 ibuprofen tablets, fever currently 99 degrees F. Pt states that she is concerned about her abdominal hernia, which she states is growning. Pt states she had hernia evaluated and MD states it is not emergent.

## 2014-08-04 NOTE — ED Notes (Signed)
Patient transported to X-ray 

## 2014-08-04 NOTE — Discharge Instructions (Signed)
Follow up with [place] [facility] in 1-2 days.   Take naprosyn for aches and fever,   Also at sometime follow up with [facility] to fix your henia

## 2014-08-05 NOTE — ED Provider Notes (Signed)
CSN: [id number]     Arrival date & time 08/04/14  1109 History   First MD Initiated Contact with Patient 08/04/14 1315     Chief Complaint  Patient presents with  . Fever  . Hernia     (Consider location/radiation/quality/duration/timing/severity/associated sxs/prior Treatment) Patient is a 44 y.o. female presenting with fever. The history is provided by the patient (the pt complains of cough and fever).  Fever Temp source:  Subjective Severity:  Moderate Onset quality:  Gradual Timing:  Intermittent Progression:  Waxing and waning Chronicity:  New Relieved by:  Nothing Worsened by:  Nothing tried Associated symptoms: cough   Associated symptoms: no chest pain, no congestion, no diarrhea, no headaches and no rash     History reviewed. No pertinent past medical history. Past Surgical History  Procedure Laterality Date  . Cholecystectomy     History reviewed. No pertinent family history. History  Substance Use Topics  . Smoking status: Current Every Day Smoker -- 0.50 packs/day    Types: Cigarettes  . Smokeless tobacco: Not on file  . Alcohol Use: Yes     Comment: rarely   OB History    No data available     Review of Systems  Constitutional: Positive for fever. Negative for appetite change and fatigue.  HENT: Negative for congestion, ear discharge and sinus pressure.   Eyes: Negative for discharge.  Respiratory: Positive for cough.   Cardiovascular: Negative for chest pain.  Gastrointestinal: Negative for abdominal pain and diarrhea.  Genitourinary: Negative for frequency and hematuria.  Musculoskeletal: Negative for back pain.  Skin: Negative for rash.  Neurological: Negative for seizures and headaches.  Psychiatric/Behavioral: Negative for hallucinations.      Allergies  Review of patient's allergies indicates no known allergies.  Home Medications   Prior to Admission medications   Medication Sig Start Date End Date Taking? Authorizing Provider   hydrOXYzine (ATARAX/VISTARIL) 25 MG tablet Take 1 tablet (25 mg total) by mouth every 6 (six) hours. 07/22/14  Yes Lonia SkinnerLeslie K Sofia, PA-C  naproxen (NAPROSYN) 500 MG tablet Take one pill twice a day for fever and aches 08/04/14   Benny LennertJoseph L Macall Mccroskey, MD  ondansetron (ZOFRAN ODT) 8 MG disintegrating tablet Take 1 tablet (8 mg total) by mouth every 8 (eight) hours as needed for nausea or vomiting. Patient not taking: Reported on 07/22/2014 05/05/14   Hilario Quarryanielle S Ray, MD  oxyCODONE-acetaminophen (PERCOCET/ROXICET) 5-325 MG per tablet Take 2 tablets by mouth every 4 (four) hours as needed for moderate pain or severe pain. Patient not taking: Reported on 07/22/2014 05/05/14   Hilario Quarryanielle S Ray, MD  predniSONE (DELTASONE) 10 MG tablet 6,5,4,3,2,1 taper Patient not taking: Reported on 08/04/2014 07/22/14   Elson AreasLeslie K Sofia, PA-C   BP 102/58 mmHg  Pulse 76  Temp(Src) 98 F (36.7 C) (Oral)  Resp 17  SpO2 100% Physical Exam  Constitutional: She is oriented to person, place, and time. She appears well-developed.  HENT:  Head: Normocephalic.  Eyes: Conjunctivae and EOM are normal. No scleral icterus.  Neck: Neck supple. No thyromegaly present.  Cardiovascular: Normal rate and regular rhythm.  Exam reveals no gallop and no friction rub.   No murmur heard. Pulmonary/Chest: No stridor. She has no wheezes. She has no rales. She exhibits no tenderness.  Abdominal: She exhibits no distension. There is no tenderness. There is no rebound.  Ventral hernia,  Easily reduced  Musculoskeletal: Normal range of motion. She exhibits no edema.  Lymphadenopathy:    She has no  cervical adenopathy.  Neurological: She is oriented to person, place, and time. She exhibits normal muscle tone. Coordination normal.  Skin: No rash noted. No erythema.  Psychiatric: She has a normal mood and affect. Her behavior is normal.    ED Course  Procedures (including critical care time) Labs Review Labs Reviewed  CBC WITH DIFFERENTIAL/PLATELET  - Abnormal; Notable for the following:    Platelets 130 (*)    Eosinophils Relative 6 (*)    All other components within normal limits  COMPREHENSIVE METABOLIC PANEL - Abnormal; Notable for the following:    Sodium 131 (*)    Glucose, Bld 109 (*)    Calcium 8.1 (*)    Albumin 3.4 (*)    AST 937 (*)    ALT 909 (*)    Alkaline Phosphatase 130 (*)    GFR calc non Af Amer 66 (*)    GFR calc Af Amer 76 (*)    All other components within normal limits  HEPATITIS PANEL, ACUTE - Abnormal; Notable for the following:    HCV Ab Reactive (*)    All other components within normal limits    Imaging Review Dg Chest 2 View  08/04/2014   CLINICAL DATA:  Body aches, headache, abdominal pain and fever  EXAM: CHEST  2 VIEW  COMPARISON:  10/05/2009  FINDINGS: Normal mediastinum and cardiac silhouette. Normal pulmonary vasculature. No evidence of effusion, infiltrate, or pneumothorax. No acute bony abnormality.  IMPRESSION: No acute cardiopulmonary process.   Electronically Signed   By: Genevive Bi M.D.   On: 08/04/2014 14:21     EKG Interpretation None      MDM   Final diagnoses:  Cough  Viral infection    Elevated liver studies,   Possible viral hepatitis,  Will do hepatitis study and refer to gi,  Pt also referred for hernia to surgery    Benny Lennert, MD 08/05/14 [phone]

## 2014-08-07 NOTE — ED Provider Notes (Signed)
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 I spoke with [facility] who required insurance and a referral and therefore recommended ID clinic for f/u. I attempted to call patient, however she was unavailable. Instructed flow manager to call patient.  1. Viral infection   2. Cough      Elwin MochaBlair Dossie Ocanas, MD 08/08/14 [phone]

## 2014-08-08 ENCOUNTER — Telehealth (HOSPITAL_BASED_OUTPATIENT_CLINIC_OR_DEPARTMENT_OTHER): Payer: Self-pay | Admitting: Emergency Medicine

## 2014-08-08 LAB — HEPATITIS PANEL, ACUTE
HCV AB: REACTIVE — AB
HEP B C IGM: NONREACTIVE
Hep A IgM: NONREACTIVE
Hepatitis B Surface Ag: NEGATIVE

## 2014-09-04 ENCOUNTER — Telehealth (HOSPITAL_COMMUNITY): Payer: Self-pay

## 2014-09-04 NOTE — ED Notes (Signed)
Unable to contact pt by mail or telephone. Unable to communicate lab results or treatment changes. 

## 2014-10-08 ENCOUNTER — Emergency Department (HOSPITAL_COMMUNITY): Payer: Self-pay

## 2014-10-08 ENCOUNTER — Emergency Department (HOSPITAL_COMMUNITY)
Admission: EM | Admit: 2014-10-08 | Discharge: 2014-10-09 | Disposition: A | Discharge status: Home / Self Care | Payer: Self-pay | Attending: Emergency Medicine | Admitting: Emergency Medicine

## 2014-10-08 ENCOUNTER — Encounter (HOSPITAL_COMMUNITY): Payer: Self-pay | Admitting: Emergency Medicine

## 2014-10-08 DIAGNOSIS — K439 Ventral hernia without obstruction or gangrene: Secondary | ICD-10-CM | POA: Insufficient documentation

## 2014-10-08 DIAGNOSIS — Z9049 Acquired absence of other specified parts of digestive tract: Secondary | ICD-10-CM | POA: Insufficient documentation

## 2014-10-08 DIAGNOSIS — Z720 Tobacco use: Secondary | ICD-10-CM | POA: Insufficient documentation

## 2014-10-08 DIAGNOSIS — N390 Urinary tract infection, site not specified: Secondary | ICD-10-CM | POA: Insufficient documentation

## 2014-10-08 DIAGNOSIS — Z7952 Long term (current) use of systemic steroids: Secondary | ICD-10-CM | POA: Insufficient documentation

## 2014-10-08 DIAGNOSIS — Z79899 Other long term (current) drug therapy: Secondary | ICD-10-CM | POA: Insufficient documentation

## 2014-10-08 DIAGNOSIS — Z791 Long term (current) use of non-steroidal anti-inflammatories (NSAID): Secondary | ICD-10-CM | POA: Insufficient documentation

## 2014-10-08 LAB — CBC WITH DIFFERENTIAL/PLATELET
Basophils Absolute: 0 10*3/uL (ref 0.0–0.1)
Basophils Relative: 0 % (ref 0–1)
Eosinophils Absolute: 0.3 10*3/uL (ref 0.0–0.7)
Eosinophils Relative: 5 % (ref 0–5)
HCT: 42.6 % (ref 36.0–46.0)
Hemoglobin: 13.4 g/dL (ref 12.0–15.0)
LYMPHS PCT: 35 % (ref 12–46)
Lymphs Abs: 2.1 10*3/uL (ref 0.7–4.0)
MCH: 29.2 pg (ref 26.0–34.0)
MCHC: 31.5 g/dL (ref 30.0–36.0)
MCV: 92.8 fL (ref 78.0–100.0)
MONOS PCT: 8 % (ref 3–12)
Monocytes Absolute: 0.5 10*3/uL (ref 0.1–1.0)
Neutro Abs: 3.2 10*3/uL (ref 1.7–7.7)
Neutrophils Relative %: 52 % (ref 43–77)
PLATELETS: 240 10*3/uL (ref 150–400)
RBC: 4.59 MIL/uL (ref 3.87–5.11)
RDW: 14.2 % (ref 11.5–15.5)
WBC: 6.1 10*3/uL (ref 4.0–10.5)

## 2014-10-08 LAB — URINALYSIS, ROUTINE W REFLEX MICROSCOPIC
Bilirubin Urine: NEGATIVE
Glucose, UA: NEGATIVE mg/dL
Hgb urine dipstick: NEGATIVE
KETONES UR: NEGATIVE mg/dL
Nitrite: POSITIVE — AB
PH: 6 (ref 5.0–8.0)
PROTEIN: NEGATIVE mg/dL
Specific Gravity, Urine: 1.013 (ref 1.005–1.030)
Urobilinogen, UA: 0.2 mg/dL (ref 0.0–1.0)

## 2014-10-08 LAB — LIPASE, BLOOD: Lipase: 33 U/L (ref 22–51)

## 2014-10-08 LAB — COMPREHENSIVE METABOLIC PANEL
ALT: 69 U/L — ABNORMAL HIGH (ref 14–54)
AST: 43 U/L — ABNORMAL HIGH (ref 15–41)
Albumin: 4 g/dL (ref 3.5–5.0)
Alkaline Phosphatase: 67 U/L (ref 38–126)
Anion gap: 5 (ref 5–15)
BILIRUBIN TOTAL: 0.4 mg/dL (ref 0.3–1.2)
BUN: 14 mg/dL (ref 6–20)
CHLORIDE: 110 mmol/L (ref 101–111)
CO2: 22 mmol/L (ref 22–32)
Calcium: 8.8 mg/dL — ABNORMAL LOW (ref 8.9–10.3)
Creatinine, Ser: 0.76 mg/dL (ref 0.44–1.00)
GFR calc non Af Amer: >60 mL/min (ref >60)
Glucose, Bld: 131 mg/dL — ABNORMAL HIGH (ref 70–99)
Potassium: 4 mmol/L (ref 3.5–5.1)
Sodium: 137 mmol/L (ref 135–145)
TOTAL PROTEIN: 7.2 g/dL (ref 6.5–8.1)

## 2014-10-08 LAB — URINE MICROSCOPIC-ADD ON

## 2014-10-08 MED ORDER — HYDROMORPHONE HCL 1 MG/ML IJ SOLN
1.0000 mg | Freq: Once | INTRAMUSCULAR | Status: AC
  Administered 2014-10-08: 1 mg via INTRAVENOUS
  Filled 2014-10-08: qty 1

## 2014-10-08 MED ORDER — ONDANSETRON HCL 4 MG/2ML IJ SOLN
4.0000 mg | Freq: Once | INTRAMUSCULAR | Status: AC
  Administered 2014-10-08: 4 mg via INTRAVENOUS
  Filled 2014-10-08: qty 2

## 2014-10-08 MED ORDER — CEFTRIAXONE SODIUM 1 G IJ SOLR
1.0000 g | Freq: Once | INTRAMUSCULAR | Status: AC
  Administered 2014-10-08: 1 g via INTRAVENOUS
  Filled 2014-10-08: qty 10

## 2014-10-08 MED ORDER — IOHEXOL 300 MG/ML  SOLN
100.0000 mL | Freq: Once | INTRAMUSCULAR | Status: AC | PRN
  Administered 2014-10-08: 100 mL via INTRAVENOUS

## 2014-10-08 MED ORDER — SODIUM CHLORIDE 0.9 % IV BOLUS (SEPSIS)
1000.0000 mL | Freq: Once | INTRAVENOUS | Status: AC
Start: 2014-10-08 — End: 2014-10-09
  Administered 2014-10-08: 1000 mL via INTRAVENOUS

## 2014-10-08 MED ORDER — IOHEXOL 300 MG/ML  SOLN
50.0000 mL | Freq: Once | INTRAMUSCULAR | Status: AC | PRN
  Administered 2014-10-08: 50 mL via ORAL

## 2014-10-08 NOTE — ED Notes (Signed)
Pt c/o R side abdominal pain since last night. Pt sts she has been dealing with some pain for a year since diagnosed with a hernia.  Pt sts yesterday that pain worsened and she vomited earlier today. Pt A&Ox4 and ambulatory.

## 2014-10-08 NOTE — ED Provider Notes (Signed)
CSN: [id number]     Arrival date & time 10/08/14  1936 History   First MD Initiated Contact with Patient 10/08/14 2210     Chief Complaint  Patient presents with  . Abdominal Pain  . Hernia  . Emesis     Patient is a 44 y.o. female presenting with abdominal pain and vomiting. The history is provided by the patient. No language interpreter was used.  Abdominal Pain Associated symptoms: vomiting   Emesis Associated symptoms: abdominal pain    Susan Trevino presents for evaluation of abdominal pain and vomiting. She reports that she's had a cholecystectomy one year ago. For the last 2 days she's had increased pain on the right side of her abdomen and right lower quadrants. She's had increased swelling at the hernia site on her upper abdomen. She's had 2 episodes of emesis today. She denies any fevers, constipation. She has a small amount of loose stools. No dysuria. Symptoms are moderate, constant, worsening.  History reviewed. No pertinent past medical history. Past Surgical History  Procedure Laterality Date  . Cholecystectomy     No family history on file. History  Substance Use Topics  . Smoking status: Current Every Day Smoker -- 0.50 packs/day    Types: Cigarettes  . Smokeless tobacco: Not on file  . Alcohol Use: Yes     Comment: rarely   OB History    No data available     Review of Systems  Gastrointestinal: Positive for vomiting and abdominal pain.  All other systems reviewed and are negative.     Allergies  Review of patient's allergies indicates no known allergies.  Home Medications   Prior to Admission medications   Medication Sig Start Date End Date Taking? Authorizing Provider  hydrOXYzine (ATARAX/VISTARIL) 25 MG tablet Take 1 tablet (25 mg total) by mouth every 6 (six) hours. 07/22/14   Elson Areas, PA-C  naproxen (NAPROSYN) 500 MG tablet Take one pill twice a day for fever and aches 08/04/14   Bethann Berkshire, MD  ondansetron (ZOFRAN ODT) 8 MG  disintegrating tablet Take 1 tablet (8 mg total) by mouth every 8 (eight) hours as needed for nausea or vomiting. Patient not taking: Reported on 07/22/2014 05/05/14   Margarita Grizzle, MD  oxyCODONE-acetaminophen (PERCOCET/ROXICET) 5-325 MG per tablet Take 2 tablets by mouth every 4 (four) hours as needed for moderate pain or severe pain. Patient not taking: Reported on 07/22/2014 05/05/14   Margarita Grizzle, MD  predniSONE (DELTASONE) 10 MG tablet 6,5,4,3,2,1 taper Patient not taking: Reported on 08/04/2014 07/22/14   Elson Areas, PA-C   BP 119/85 mmHg  Pulse 77  Temp(Src) 98.2 F (36.8 C) (Oral)  SpO2 97% Physical Exam  Constitutional: She is oriented to person, place, and time. She appears well-developed and well-nourished.  HENT:  Head: Normocephalic and atraumatic.  Cardiovascular: Normal rate and regular rhythm.   No murmur heard. Pulmonary/Chest: Effort normal and breath sounds normal. No respiratory distress.  Abdominal: Soft. There is no rebound and no guarding.  Midline ventral supraumbilical hernia with mild diffuse tenderness to palpation.  Abdomen soft and hernia is reducible on exam.    Musculoskeletal: She exhibits no edema or tenderness.  Neurological: She is alert and oriented to person, place, and time.  Skin: Skin is warm and dry.  Psychiatric: She has a normal mood and affect. Her behavior is normal.  Nursing note and vitals reviewed.   ED Course  Procedures (including critical care time) Labs Review Labs Reviewed  COMPREHENSIVE METABOLIC PANEL - Abnormal; Notable for the following:    Glucose, Bld 131 (*)    Calcium 8.8 (*)    AST 43 (*)    ALT 69 (*)    All other components within normal limits  URINALYSIS, ROUTINE W REFLEX MICROSCOPIC - Abnormal; Notable for the following:    APPearance CLOUDY (*)    Nitrite POSITIVE (*)    Leukocytes, UA TRACE (*)    All other components within normal limits  URINE MICROSCOPIC-ADD ON - Abnormal; Notable for the following:     Bacteria, UA MANY (*)    All other components within normal limits  CBC WITH DIFFERENTIAL/PLATELET  LIPASE, BLOOD    Imaging Review Ct Abdomen Pelvis W Contrast  10/08/2014   CLINICAL DATA:  Right-sided abdominal pain for 1 day.  Vomiting.  EXAM: CT ABDOMEN AND PELVIS WITH CONTRAST  TECHNIQUE: Multidetector CT imaging of the abdomen and pelvis was performed using the standard protocol following bolus administration of intravenous contrast.  CONTRAST:  50mL OMNIPAQUE IOHEXOL 300 MG/ML SOLN, 100mL OMNIPAQUE IOHEXOL 300 MG/ML SOLN  COMPARISON:  CT 07/24/2013  FINDINGS: Lower chest: Lung bases are clear.  Hepatobiliary: No focal hepatic lesion. Post cholecystectomy. 2.5 cm benign appearing fluid collection in the gallbladder fossa. The common bile duct is mildly dilated following cholecystectomy measuring 8 mm. No intrahepatic biliary duct dilatation.  Pancreas: Pancreas is normal. No ductal dilatation. No pancreatic inflammation.  Spleen: Normal spleen  Adrenals/urinary tract: Adrenal glands and kidneys are normal. The ureters and bladder normal.  Stomach/Bowel: Stomach, small bowel, appendix, and cecum are normal. The colon and rectosigmoid colon are normal.  Vascular/Lymphatic: Abdominal aorta is normal caliber. There is no retroperitoneal or periportal lymphadenopathy. No pelvic lymphadenopathy.  Reproductive: Uterus and ovaries are normal.  Musculoskeletal: No aggressive osseous lesion.  Other: There is a large a ventral hernia superior the umbilicus which is wide-mouth. The mouth measures 6 cm on the sac measures 12 cm. No bowel enters the hernia sac.  IMPRESSION: 1. Common bile duct dilatation following cholecystectomy without evidence of high-grade obstruction. 2. Small fluid collection in the gallbladder fossa following cholecystectomy. 3. Large ventral hernia superior to the umbilicus without complicating features. 4. Normal appendix.   Electronically Signed   By: Genevive BiStewart  Edmunds M.D.   On:  10/08/2014 23:36     EKG Interpretation None      MDM   Final diagnoses:  Ventral hernia without obstruction or gangrene  Acute lower UTI   Patient here with abdominal pain, has reducible hernia on examination. CT abdomen obtained to evaluate for evidence of appendicitis or incarceration given right lower quadrant pain and vomiting. UA is consistent with urinary tract infection, treating with Keflex. Discussed with patient the importance of surgery follow-up as well as return precautions.    Tilden FossaElizabeth Kenyon Eshleman, MD 10/09/14 [phone]

## 2014-10-09 MED ORDER — CEPHALEXIN 500 MG PO CAPS: 500.0000 mg | ORAL_CAPSULE | Freq: Two times a day (BID) | ORAL | Status: DC

## 2014-10-09 MED ORDER — PROMETHAZINE HCL 25 MG PO TABS: 25.0000 mg | ORAL_TABLET | Freq: Four times a day (QID) | ORAL | Status: DC | PRN

## 2014-10-09 MED ORDER — HYDROCODONE-ACETAMINOPHEN 5-325 MG PO TABS: 1.0000 | ORAL_TABLET | Freq: Four times a day (QID) | ORAL | Status: DC | PRN

## 2014-10-09 NOTE — Discharge Instructions (Signed)
Ventral Hernia °A ventral hernia (also called an incisional hernia) is a hernia that occurs at the site of a previous surgical cut (incision) in the abdomen. The abdominal wall spans from your lower chest down to your pelvis. If the abdominal wall is weakened from a surgical incision, a hernia can occur. A hernia is a bulge of bowel or muscle tissue pushing out on the weakened part of the abdominal wall. Ventral hernias can get bigger from straining or lifting. °Obese and older people are at higher risk for a ventral hernia. People who develop infections after surgery or require repeat incisions at the same site on the abdomen are also at increased risk. °CAUSES  °A ventral hernia occurs because of weakness in the abdominal wall at an incision site.  °SYMPTOMS  °Common symptoms include: °· A visible bulge or lump on the abdominal wall. °· Pain or tenderness around the lump. °· Increased discomfort if you cough or make a sudden movement. °If the hernia has blocked part of the intestine, a serious complication can occur (incarcerated or strangulated hernia). This can become a problem that requires emergency surgery because the blood flow to the blocked intestine may be cut off. Symptoms may include: °· Feeling sick to your stomach (nauseous). °· Throwing up (vomiting). °· Stomach swelling (distention) or bloating. °· Fever. °· Rapid heartbeat. °DIAGNOSIS  °Your health care provider will take a medical history and perform a physical exam. Various tests may be ordered, such as: °· Blood tests. °· Urine tests. °· Ultrasonography. °· X-rays. °· Computed tomography (CT). °TREATMENT  °Watchful waiting may be all that is needed for a smaller hernia that does not cause symptoms. Your health care provider may recommend the use of a supportive belt (truss) that helps to keep the abdominal wall intact. For larger hernias or those that cause pain, surgery to repair the hernia is usually recommended. If a hernia becomes  strangulated, emergency surgery needs to be done right away. °HOME CARE INSTRUCTIONS °· Avoid putting pressure or strain on the abdominal area. °· Avoid heavy lifting. °· Use good body positioning for physical tasks. Ask your health care provider about proper body positioning. °· Use a supportive belt as directed by your health care provider. °· Maintain a healthy weight. °· Eat foods that are high in fiber, such as whole grains, fruits, and vegetables. Fiber helps prevent difficult bowel movements (constipation). °· Drink enough fluids to keep your urine clear or pale yellow. °· Follow up with your health care provider as directed. °SEEK MEDICAL CARE IF:  °· Your hernia seems to be getting larger or more painful. °SEEK IMMEDIATE MEDICAL CARE IF:  °· You have abdominal pain that is sudden and sharp. °· Your pain becomes severe. °· You have repeated vomiting. °· You are sweating a lot. °· You notice a rapid heartbeat. °· You develop a fever. °MAKE SURE YOU:  °· Understand these instructions. °· Will watch your condition. °· Will get help right away if you are not doing well or get worse. °Document Released: 05/11/2012 Document Revised: 10/09/2013 Document Reviewed: 05/11/2012 °[employer]® Patient Information ©2015 [employer]. This information is not intended to replace advice given to you by your health care provider. Make sure you discuss any questions you have with your health care provider. ° °Urinary Tract Infection °Urinary tract infections (UTIs) can develop anywhere along your urinary tract. Your urinary tract is your body's drainage system for removing wastes and extra water. Your urinary tract includes two kidneys, two   ureters, a bladder, and a urethra. Your kidneys are a pair of bean-shaped organs. Each kidney is about the size of your fist. They are located below your ribs, one on each side of your spine. °CAUSES °Infections are caused by microbes, which are microscopic organisms, including fungi,  viruses, and bacteria. These organisms are so small that they can only be seen through a microscope. Bacteria are the microbes that most commonly cause UTIs. °SYMPTOMS  °Symptoms of UTIs may vary by age and gender of the patient and by the location of the infection. Symptoms in young women typically include a frequent and intense urge to urinate and a painful, burning feeling in the bladder or urethra during urination. Older women and men are more likely to be tired, shaky, and weak and have muscle aches and abdominal pain. A fever may mean the infection is in your kidneys. Other symptoms of a kidney infection include pain in your back or sides below the ribs, nausea, and vomiting. °DIAGNOSIS °To diagnose a UTI, your caregiver will ask you about your symptoms. Your caregiver also will ask to provide a urine sample. The urine sample will be tested for bacteria and white blood cells. White blood cells are made by your body to help fight infection. °TREATMENT  °Typically, UTIs can be treated with medication. Because most UTIs are caused by a bacterial infection, they usually can be treated with the use of antibiotics. The choice of antibiotic and length of treatment depend on your symptoms and the type of bacteria causing your infection. °HOME CARE INSTRUCTIONS °· If you were prescribed antibiotics, take them exactly as your caregiver instructs you. Finish the medication even if you feel better after you have only taken some of the medication. °· Drink enough water and fluids to keep your urine clear or pale yellow. °· Avoid caffeine, tea, and carbonated beverages. They tend to irritate your bladder. °· Empty your bladder often. Avoid holding urine for long periods of time. °· Empty your bladder before and after sexual intercourse. °· After a bowel movement, women should cleanse from front to back. Use each tissue only once. °SEEK MEDICAL CARE IF:  °· You have back pain. °· You develop a fever. °· Your symptoms do not  begin to resolve within 3 days. °SEEK IMMEDIATE MEDICAL CARE IF:  °· You have severe back pain or lower abdominal pain. °· You develop chills. °· You have nausea or vomiting. °· You have continued burning or discomfort with urination. °MAKE SURE YOU:  °· Understand these instructions. °· Will watch your condition. °· Will get help right away if you are not doing well or get worse. °Document Released: 03/04/2005 Document Revised: 11/24/2011 Document Reviewed: 07/03/2011 °ExitCare® Patient Information ©2015 [employer]. This information is not intended to replace advice given to you by your health care provider. Make sure you discuss any questions you have with your health care provider. ° °

## 2017-01-28 IMAGING — CT CT ABD-PELV W/ CM
2 of 5 series · 16 of 46 positions shown, 18 images · IV contrast (OMNIPAQUE 300)
Comparison: CT 07/24/2013

CLINICAL DATA: Right-sided abdominal pain for 1 day.  Vomiting.

EXAM:
CT ABDOMEN AND PELVIS WITH CONTRAST
TECHNIQUE: Multidetector CT imaging of the abdomen and pelvis was performed
using the standard protocol following bolus administration of
intravenous contrast.
CONTRAST:  50mL OMNIPAQUE IOHEXOL 300 MG/ML SOLN, 100mL OMNIPAQUE
IOHEXOL 300 MG/ML SOLN

[Series 2: abd/pel with · axial · 0.74mm/px · z∈[-233,+172]mm · 13 of 91 slices shown, 15 images]
[im 5/91  soft-tissue]
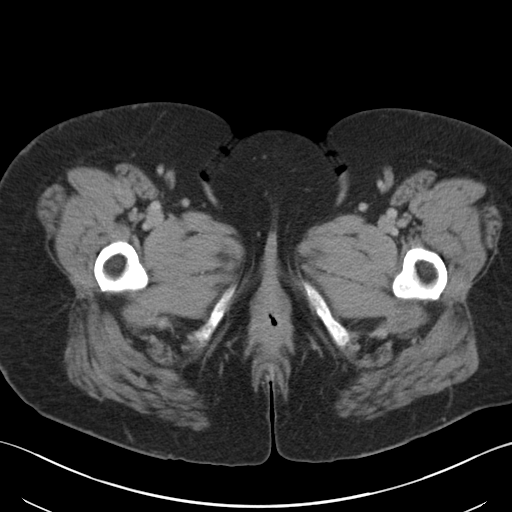
[im 5/91  bone]
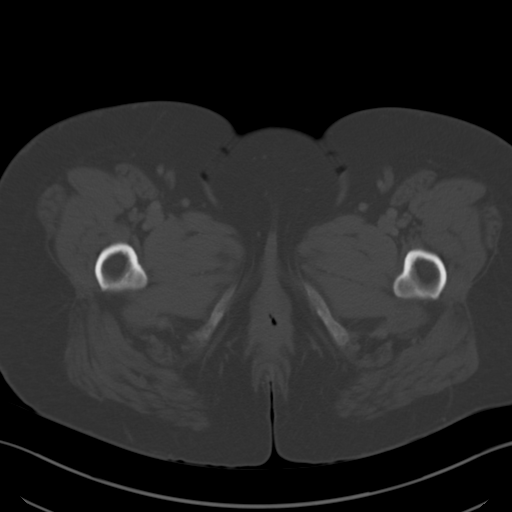
[im 14/91  soft-tissue]
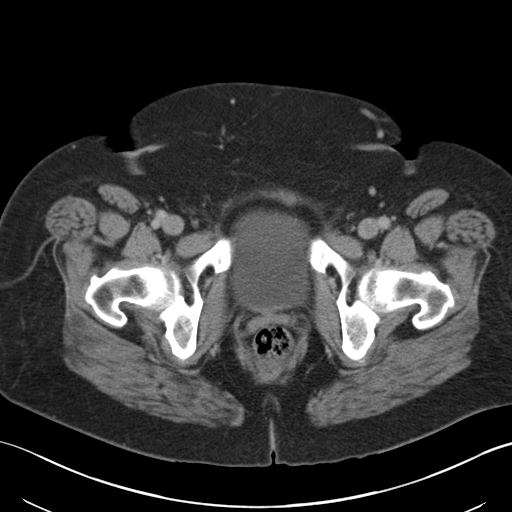
[im 19/91  soft-tissue]
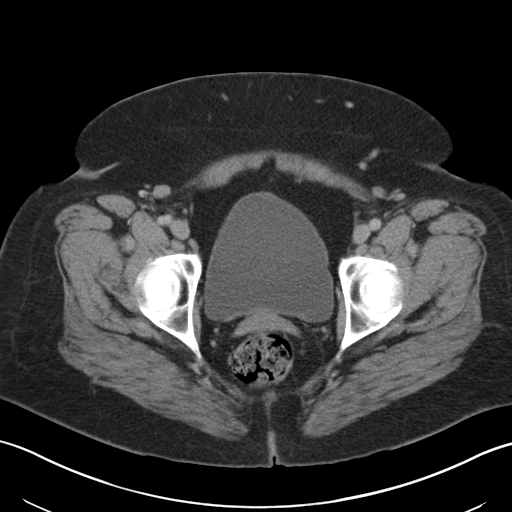
[im 28/91  soft-tissue]
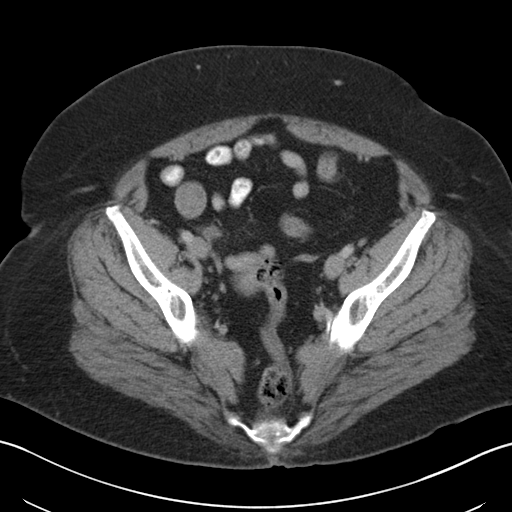
[im 32/91  soft-tissue]
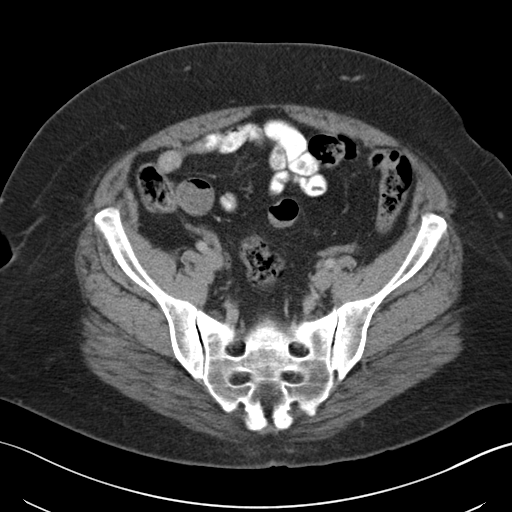
[im 41/91  soft-tissue]
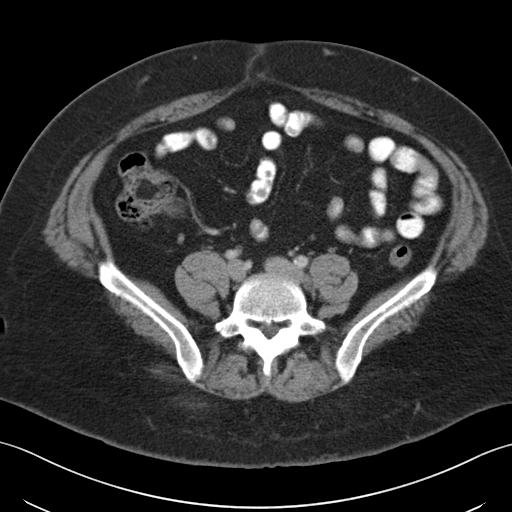
[im 46/91  soft-tissue]
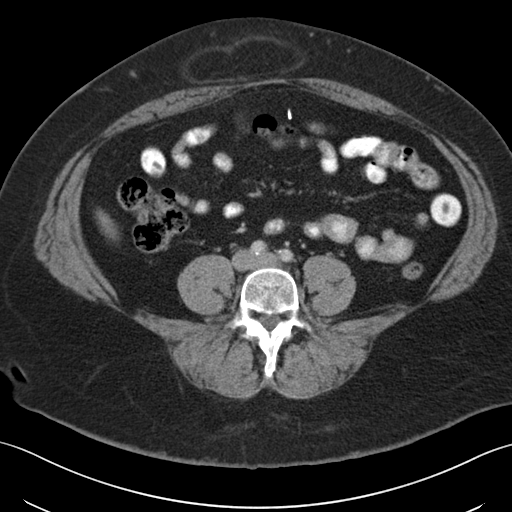
[im 50/91  soft-tissue]
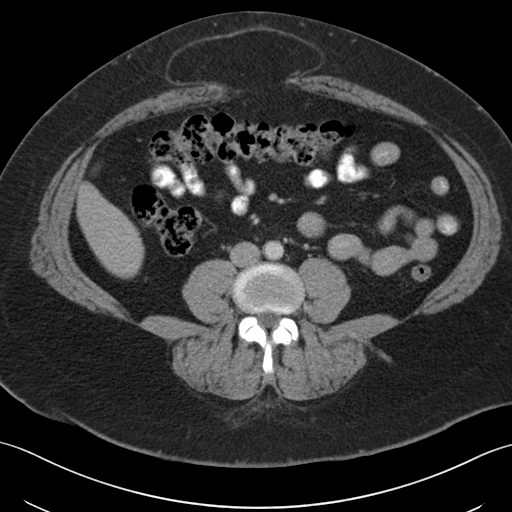
[im 59/91  soft-tissue]
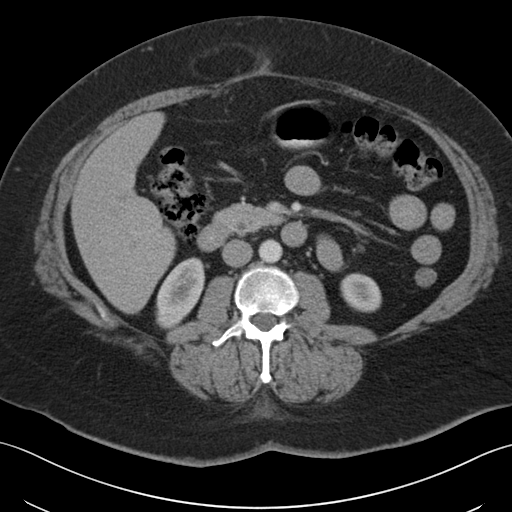
[im 59/91  bone]
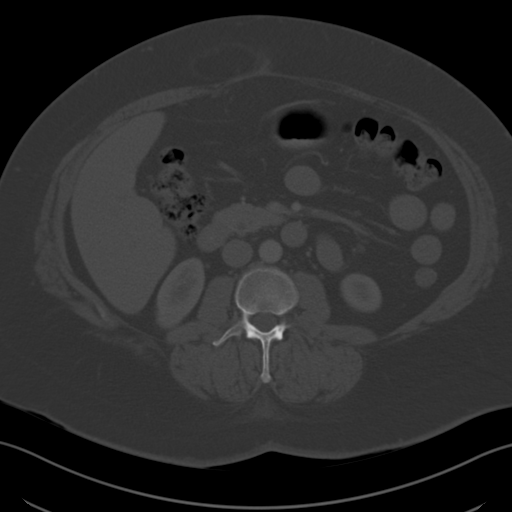
[im 64/91  soft-tissue]
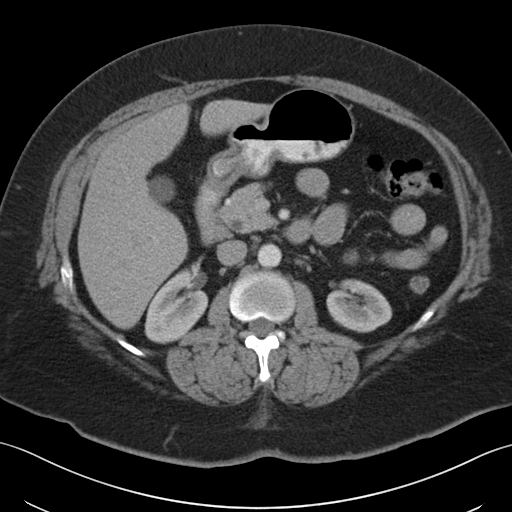
[im 73/91  soft-tissue]
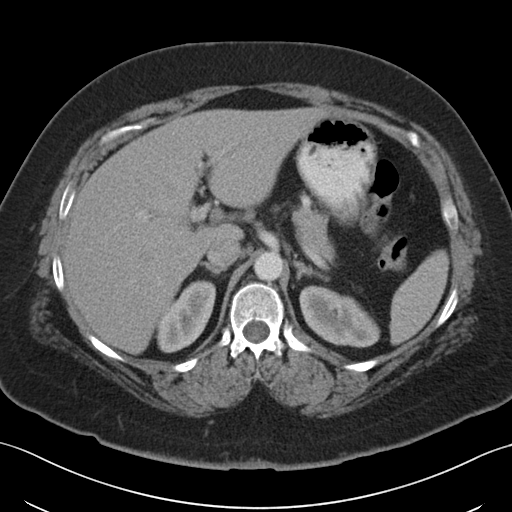
[im 77/91  soft-tissue]
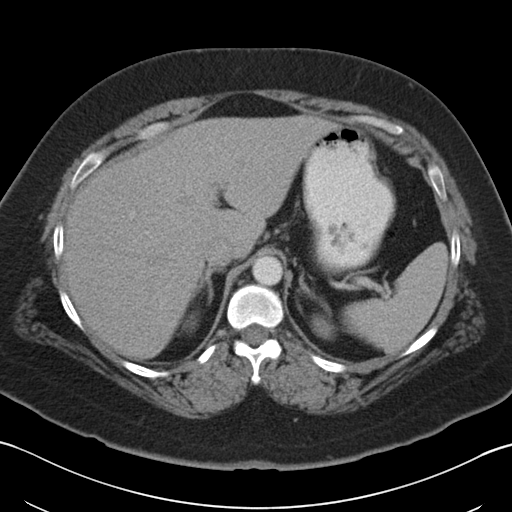
[im 86/91  soft-tissue]
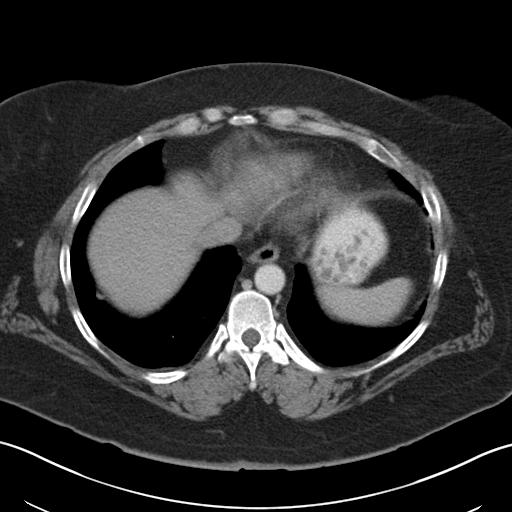

[Series 5: coronal a/|p · coronal · 0.74mm/px · 3 of 121 slices shown]
[im 41/121  soft-tissue]
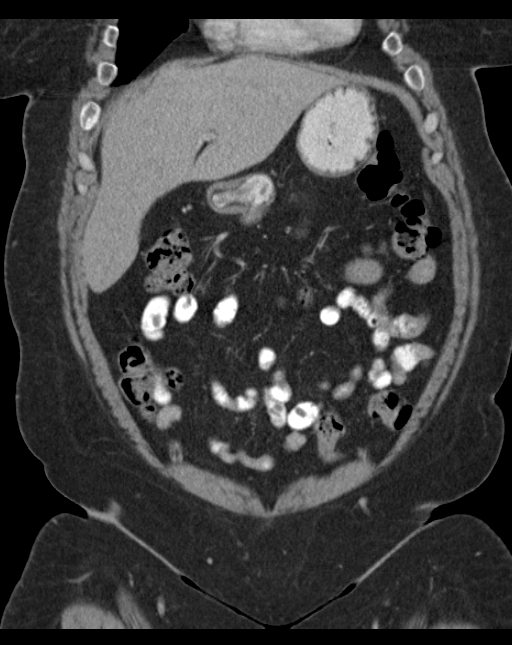
[im 54/121  soft-tissue]
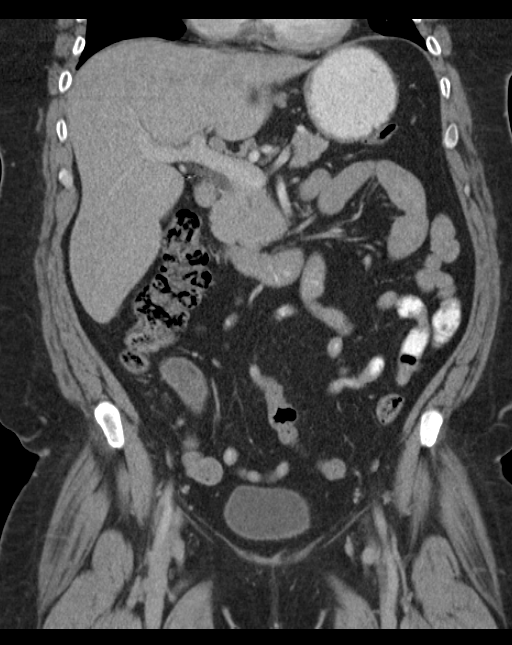
[im 67/121  soft-tissue]
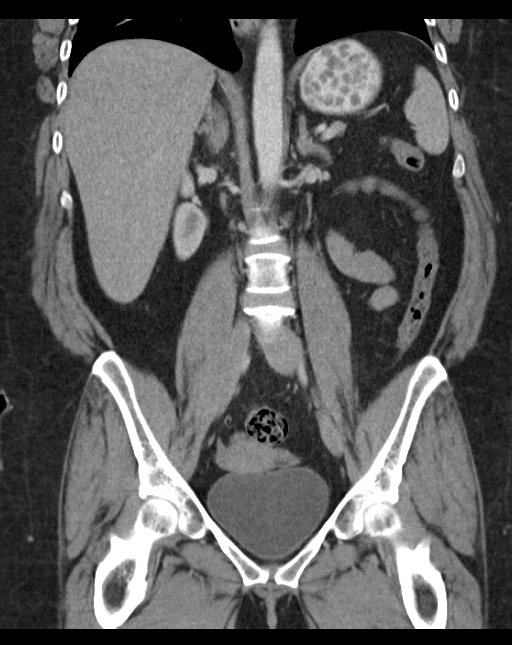

[16 of 46 positions shown; findings below may reference images not displayed]

FINDINGS: Lower chest: Lung bases are clear.

Hepatobiliary: No focal hepatic lesion. Post cholecystectomy. 2.5 cm
benign appearing fluid collection in the gallbladder fossa. The
common bile duct is mildly dilated following cholecystectomy
measuring 8 mm. No intrahepatic biliary duct dilatation.

Pancreas: Pancreas is normal. No ductal dilatation. No pancreatic
inflammation.

Spleen: Normal spleen

Adrenals/urinary tract: Adrenal glands and kidneys are normal. The
ureters and bladder normal.

Stomach/Bowel: Stomach, small bowel, appendix, and cecum are normal.
The colon and rectosigmoid colon are normal.

Vascular/Lymphatic: Abdominal aorta is normal caliber. There is no
retroperitoneal or periportal lymphadenopathy. No pelvic
lymphadenopathy.

Reproductive: Uterus and ovaries are normal.

Musculoskeletal: No aggressive osseous lesion.

Other: There is a large a ventral hernia superior the umbilicus
which is wide-mouth. The mouth measures 6 cm on the sac measures 12
cm. No bowel enters the hernia sac.
IMPRESSION: 1. Common bile duct dilatation following cholecystectomy without
evidence of high-grade obstruction.
2. Small fluid collection in the gallbladder fossa following
cholecystectomy.
3. Large ventral hernia superior to the umbilicus without
complicating features.
4. Normal appendix.

## 2024-02-22 ENCOUNTER — Ambulatory Visit
Admission: EM | Admit: 2024-02-22 | Discharge: 2024-02-22 | Disposition: A | Discharge status: Home / Self Care | Attending: Physician Assistant | Admitting: Physician Assistant

## 2024-02-22 ENCOUNTER — Other Ambulatory Visit: Payer: Self-pay

## 2024-02-22 DIAGNOSIS — H1131 Conjunctival hemorrhage, right eye: Secondary | ICD-10-CM

## 2024-02-22 DIAGNOSIS — S0011XA Contusion of right eyelid and periocular area, initial encounter: Secondary | ICD-10-CM | POA: Diagnosis not present

## 2024-02-22 DIAGNOSIS — H5789 Other specified disorders of eye and adnexa: Secondary | ICD-10-CM | POA: Diagnosis not present

## 2024-02-22 MED ORDER — ERYTHROMYCIN 5 MG/GM OP OINT: TOPICAL_OINTMENT | Freq: Four times a day (QID) | OPHTHALMIC | 0 refills | Status: AC

## 2024-02-22 MED ORDER — CEPHALEXIN 500 MG PO CAPS: 500.0000 mg | ORAL_CAPSULE | Freq: Four times a day (QID) | ORAL | 0 refills | Status: AC

## 2024-02-22 NOTE — Discharge Instructions (Addendum)
 VISIT SUMMARY:  You came in today because of eye pain and irritation that you've been experiencing for a couple of days. You described a scratchy feeling in your eye, along with headaches and intermittent blurry vision. You also noticed some watering and blood when wiping your eye this morning. You have a history of wearing contact lenses but are not currently using them.  YOUR PLAN:  -ACUTE CONJUNCTIVITIS: Acute conjunctivitis is an inflammation of the eye's outer membrane, often caused by an infection. You have significant irritation and swelling, with a scratchy sensation and intermittent blurry vision. There is no foreign body or corneal abrasion detected. You will be prescribed an antibiotic eye ointment and oral antibiotics to treat the potential infection. Use lubricating eye drops like Blink Tears or Refresh Tears as needed for irritation. Seek emergency care if your symptoms worsen, including increased swelling, vision changes, or pain with eye movement. It is recommended to go to [facility] or [facility] for emergency care due to the likely availability of an on-call eye specialist.  INSTRUCTIONS:  Please follow the prescribed treatment plan and use the medications as directed. If your symptoms worsen, including increased swelling, vision changes, or pain with eye movement, seek emergency care at [facility] or [facility] where an on-call eye specialist is available.

## 2024-02-22 NOTE — ED Provider Notes (Signed)
 [facility]    CSN: [id number] Arrival date & time: 02/22/24  1937      History   Chief Complaint Chief Complaint  Patient presents with   Eye Problem    HPI Susan Trevino is a 53 y.o. female.  has no past medical history on file.   HPI Discussed the use of AI scribe software for clinical note transcription with the patient, who gave verbal consent to proceed. The patient presents with eye pain and irritation.  She has been experiencing eye pain for a couple of days, initially feeling like there was a foreign body in the eye. Attempts to remove it with her finger provided temporary relief. This morning, she noticed watering and blood when wiping the eye.  She has a history of wearing contact lenses, which previously resulted in a scratched eyelid, but she notes this current issue is more severe. The eye pain is accompanied by headaches and intermittent blurry vision. She describes a persistent scratchy feeling, especially when moving the eye with the eyelid closed.  She denies current use of contact lenses and reports no known allergies. She has been alternating between Tylenol  and ibuprofen for pain relief. She also mentions a recent incident where she injured her fingers, describing it as a 'bad week' with her birthday approaching.  No light sensitivity or double vision. No current blood discharge from the eye but describes signs of infection. No history of yeast infections when on antibiotics.    History reviewed. No pertinent past medical history.  There are no active problems to display for this patient.   Past Surgical History:  Procedure Laterality Date   CHOLECYSTECTOMY      OB History   No obstetric history on file.      Home Medications    Prior to Admission medications   Medication Sig Start Date End Date Taking? Authorizing Provider  cephALEXin  (KEFLEX ) 500 MG capsule Take 1 capsule (500 mg total) by mouth 4 (four) times daily for 7  days. 02/22/24 02/29/24 Yes Burdette Gergely E, PA-C  erythromycin  ophthalmic ointment Place into the right eye 4 (four) times daily for 7 days. Place a 1/2 inch ribbon of ointment into the lower eyelid. 02/22/24 02/29/24 Yes Aarron Wierzbicki E, PA-C  HYDROcodone -acetaminophen  (NORCO/VICODIN) 5-325 MG per tablet Take 1 tablet by mouth every 6 (six) hours as needed. 10/09/14   Griselda Norris, MD  hydrOXYzine  (ATARAX /VISTARIL ) 25 MG tablet Take 1 tablet (25 mg total) by mouth every 6 (six) hours. Patient not taking: Reported on 10/08/2014 07/22/14   Sofia, Leslie K, PA-C  ibuprofen (ADVIL,MOTRIN) 200 MG tablet Take 800 mg by mouth every 6 (six) hours as needed (for pain.).    [provider]  naproxen  (NAPROSYN ) 500 MG tablet Take one pill twice a day for fever and aches Patient not taking: Reported on 10/08/2014 08/04/14   Zammit, Joseph, MD  ondansetron  (ZOFRAN  ODT) 8 MG disintegrating tablet Take 1 tablet (8 mg total) by mouth every 8 (eight) hours as needed for nausea or vomiting. Patient not taking: Reported on 07/22/2014 05/05/14   Levander Houston, MD  oxyCODONE -acetaminophen  (PERCOCET/ROXICET) 5-325 MG per tablet Take 2 tablets by mouth every 4 (four) hours as needed for moderate pain or severe pain. Patient not taking: Reported on 07/22/2014 05/05/14   Levander Houston, MD  predniSONE  (DELTASONE ) 10 MG tablet 6,5,4,3,2,1 taper Patient not taking: Reported on 08/04/2014 07/22/14   Sofia, Leslie K, PA-C  promethazine  (PHENERGAN ) 25 MG tablet Take 1 tablet (  25 mg total) by mouth every 6 (six) hours as needed for nausea or vomiting. 10/09/14   Griselda Norris, MD    Family History History reviewed. No pertinent family history.  Social History Social History   Tobacco Use   Smoking status: Every Day    Current packs/day: 0.50    Types: Cigarettes  Vaping Use   Vaping status: Never Used  Substance Use Topics   Alcohol use: Yes    Comment: rarely   Drug use: No     Allergies   Patient has no known  allergies.   Review of Systems Review of Systems  Eyes:  Positive for pain, discharge, redness and visual disturbance. Negative for photophobia.       Pt has right upper eyelid swelling   Neurological:  Positive for headaches.     Physical Exam Triage Vital Signs ED Triage Vitals  Encounter Vitals Group     BP 02/22/24 2010 (!) 164/84     Girls Systolic BP Percentile --      Girls Diastolic BP Percentile --      Boys Systolic BP Percentile --      Boys Diastolic BP Percentile --      Pulse Rate 02/22/24 2010 77     Resp 02/22/24 2010 18     Temp 02/22/24 2010 98.7 F (37.1 C)     Temp Source 02/22/24 2010 Oral     SpO2 02/22/24 2010 95 %     Weight 02/22/24 2012 220 lb (99.8 kg)     Height 02/22/24 2012 5' 3 (1.6 m)     Head Circumference --      Peak Flow --      Pain Score 02/22/24 2012 10     Pain Loc --      Pain Education --      Exclude from Growth Chart --    No data found.  Updated Vital Signs BP (!) 164/84 (BP Location: Right Arm)   Pulse 77   Temp 98.7 F (37.1 C) (Oral)   Resp 18   Ht 5' 3 (1.6 m)   Wt 220 lb (99.8 kg)   SpO2 95%   BMI 38.97 kg/m   Visual Acuity Right Eye Distance:   Left Eye Distance:   Bilateral Distance:    Right Eye Near:   Left Eye Near:    Bilateral Near:     Physical Exam Vitals reviewed.  Constitutional:      General: She is awake.     Appearance: Normal appearance. She is well-developed and well-groomed.  HENT:     Head: Normocephalic and atraumatic. Right periorbital erythema present.  Eyes:     General: Lids are everted, no foreign bodies appreciated. Gaze aligned appropriately.        Right eye: Discharge (watery discharge) present. No foreign body or hordeolum.     Extraocular Movements: Extraocular movements intact.     Right eye: Normal extraocular motion and no nystagmus.     Left eye: Normal extraocular motion and no nystagmus.     Conjunctiva/sclera:     Right eye: Right conjunctiva is injected.  Hemorrhage present. No chemosis or exudate.    Pupils: Pupils are equal, round, and reactive to light. Pupils are equal.     Right eye: Pupil is round, reactive and not sluggish. No corneal abrasion or fluorescein uptake.     Comments: Bruising and swelling present along the right upper eyelid   Pulmonary:  Effort: Pulmonary effort is normal.  Neurological:     Mental Status: She is alert and oriented to person, place, and time.  Psychiatric:        Attention and Perception: Attention and perception normal.        Mood and Affect: Mood and affect normal.        Speech: Speech normal.        Behavior: Behavior normal. Behavior is cooperative.      UC Treatments / Results  Labs (all labs ordered are listed, but only abnormal results are displayed) Labs Reviewed - No data to display  EKG   Radiology No results found.  Procedures Procedures (including critical care time)  Medications Ordered in UC Medications - No data to display  Initial Impression / Assessment and Plan / UC Course  I have reviewed the triage vital signs and the nursing notes.  Pertinent labs & imaging results that were available during my care of the patient were reviewed by me and considered in my medical decision making (see chart for details).     eGFR of  63 per labs from encounter on 12/29/23  Final Clinical Impressions(s) / UC Diagnoses   Final diagnoses:  Periorbital ecchymosis of right eye, initial encounter  Subconjunctival hemorrhage of right eye  Periorbital swelling  Eye drainage   Acute conjunctivitis, unspecified eye Acute conjunctivitis with significant irritation and swelling, presenting with a scratchy sensation, intermittent blurry vision, and episodes of wiping away blood. No foreign body detected under the eyelid and no corneal abrasion on [place] exam. Differential includes subconjunctival hemorrhage, periorbital cellulitis, corneal inflammation/irritation. No contact lens  use, no medication allergies, or history of yeast infections with antibiotics. - Prescribe antibiotic eye ointment and oral antibiotics to cover potential infection. - Advise use of lubricating eye drops such as Blink Tears or Refresh Tears as needed for irritation. - Instruct to seek emergency care if symptoms worsen, including increased swelling, vision changes, or pain with eye movement. - Recommend going to [facility] or [facility] for emergency care due to availability of on-call ophthalmologist.    Discharge Instructions      VISIT SUMMARY:  You came in today because of eye pain and irritation that you've been experiencing for a couple of days. You described a scratchy feeling in your eye, along with headaches and intermittent blurry vision. You also noticed some watering and blood when wiping your eye this morning. You have a history of wearing contact lenses but are not currently using them.  YOUR PLAN:  -ACUTE CONJUNCTIVITIS: Acute conjunctivitis is an inflammation of the eye's outer membrane, often caused by an infection. You have significant irritation and swelling, with a scratchy sensation and intermittent blurry vision. There is no foreign body or corneal abrasion detected. You will be prescribed an antibiotic eye ointment and oral antibiotics to treat the potential infection. Use lubricating eye drops like Blink Tears or Refresh Tears as needed for irritation. Seek emergency care if your symptoms worsen, including increased swelling, vision changes, or pain with eye movement. It is recommended to go to [facility] or [facility] for emergency care due to the likely availability of an on-call eye specialist.  INSTRUCTIONS:  Please follow the prescribed treatment plan and use the medications as directed. If your symptoms worsen, including increased swelling, vision changes, or pain with eye movement, seek emergency care at [facility] or [facility] where an on-call eye specialist is available.     ED  Prescriptions     Medication  Sig Dispense Auth. Provider   cephALEXin  (KEFLEX ) 500 MG capsule Take 1 capsule (500 mg total) by mouth 4 (four) times daily for 7 days. 28 capsule Anjani Feuerborn E, PA-C   erythromycin  ophthalmic ointment Place into the right eye 4 (four) times daily for 7 days. Place a 1/2 inch ribbon of ointment into the lower eyelid. 3.5 g Koreena Joost E, PA-C      PDMP not reviewed this encounter.   Marylene Rocky BRAVO, PA-C 02/22/24 2054

## 2024-02-22 NOTE — ED Triage Notes (Signed)
 Pt presents with complaints of right eye problem. Feels like there is something in the eye. Eye is swollen and red on assessment. Wiped the eye this morning and pt states there was blood on the tissue.  Felt like there was something in the eye a few days ago. Pt states she may have irritated the eye by rubbing. Woke up this morning with the swelling and redness and became concerned. 10/10 pain reported.

## 2024-04-13 ENCOUNTER — Other Ambulatory Visit: Payer: Self-pay | Admitting: Family

## 2024-04-13 ENCOUNTER — Encounter: Payer: Self-pay | Admitting: Family

## 2024-04-13 ENCOUNTER — Ambulatory Visit: Admitting: Family

## 2024-04-13 VITALS — BP 143/80 | HR 77 | Temp 98.3°F | Resp 18 | Ht 63.0 in | Wt 248.6 lb

## 2024-04-13 DIAGNOSIS — Z803 Family history of malignant neoplasm of breast: Secondary | ICD-10-CM

## 2024-04-13 DIAGNOSIS — Z23 Encounter for immunization: Secondary | ICD-10-CM | POA: Diagnosis not present

## 2024-04-13 DIAGNOSIS — Z1231 Encounter for screening mammogram for malignant neoplasm of breast: Secondary | ICD-10-CM

## 2024-04-13 DIAGNOSIS — N6310 Unspecified lump in the right breast, unspecified quadrant: Secondary | ICD-10-CM

## 2024-04-13 DIAGNOSIS — J45909 Unspecified asthma, uncomplicated: Secondary | ICD-10-CM

## 2024-04-13 DIAGNOSIS — N644 Mastodynia: Secondary | ICD-10-CM

## 2024-04-13 DIAGNOSIS — Z0100 Encounter for examination of eyes and vision without abnormal findings: Secondary | ICD-10-CM

## 2024-04-13 DIAGNOSIS — R030 Elevated blood-pressure reading, without diagnosis of hypertension: Secondary | ICD-10-CM

## 2024-04-13 DIAGNOSIS — R635 Abnormal weight gain: Secondary | ICD-10-CM

## 2024-04-13 DIAGNOSIS — Z7689 Persons encountering health services in other specified circumstances: Secondary | ICD-10-CM

## 2024-04-13 MED ORDER — ALBUTEROL SULFATE HFA 108 (90 BASE) MCG/ACT IN AERS: 2.0000 | INHALATION_SPRAY | Freq: Four times a day (QID) | RESPIRATORY_TRACT | 2 refills | Status: DC | PRN

## 2024-04-13 NOTE — Progress Notes (Unsigned)
 Patient found small lump in the right breast, patient needs a referral to get a mammogram done,patient needs an inhaler, patient needs something to get weight out

## 2024-04-13 NOTE — Progress Notes (Unsigned)
 Subjective:    Susan Trevino - 53 y.o. female MRN [id number]  Date of birth: 28-Jan-1971  HPI  Susan Trevino is to establish care. She is accompanied by her daughter.   Current issues and/or concerns: - States she found right breast lump yesterday. States several days before she found right breast lump she noticed soreness of the same. States she has never had a mammogram. Family history of breast cancer.  - Asthma. Doing well on Albuterol inhaler, no issues/concerns. Denies red flag symptoms associated with asthma. Has not been seen by a lung specialist.  - Requests referral to eye doctor for routine exam. - Weight gain. She is trying to watch what she eats. She does not exercise outside of normal routine. Requests referral to weight specialist.  - Blood pressure check. She does not complain of red flag symptoms such as but not limited to chest pain, shortness of breath, worst headache of life, nausea/vomiting.    ROS per HPI   Health Maintenance:  Health Maintenance Due  Topic Date Due   HIV Screening  Never done   Hepatitis C Screening  Never done   Pneumococcal Vaccine: 50+ Years (1 of 2 - PCV) Never done   Cervical Cancer Screening (HPV/Pap Cotest)  Never done   Mammogram  Never done   Colonoscopy  Never done   Zoster Vaccines- Shingrix (1 of 2) Never done   Influenza Vaccine  Never done     Past Medical History: There are no active problems to display for this patient.     Social History   reports that she has been smoking cigarettes. She does not have any smokeless tobacco history on file. She reports current alcohol use. She reports that she does not use drugs.   Family History  family history is not on file.   Medications: reviewed and updated   Objective:   Physical Exam BP (!) 143/80   Pulse 77   Temp 98.3 F (36.8 C) (Oral)   Resp 18   Ht 5' 3 (1.6 m)   Wt 248 lb 9.6 oz (112.8 kg)   SpO2 93%   BMI 44.04 kg/m   Physical Exam HENT:      Head: Normocephalic and atraumatic.     Nose: Nose normal.     Mouth/Throat:     Mouth: Mucous membranes are moist.     Pharynx: Oropharynx is clear.  Eyes:     Extraocular Movements: Extraocular movements intact.     Conjunctiva/sclera: Conjunctivae normal.     Pupils: Pupils are equal, round, and reactive to light.  Cardiovascular:     Rate and Rhythm: Normal rate and regular rhythm.     Pulses: Normal pulses.     Heart sounds: Normal heart sounds.  Pulmonary:     Effort: Pulmonary effort is normal.     Breath sounds: Normal breath sounds.  Chest:     Comments: Patient declined. Musculoskeletal:        General: Normal range of motion.     Cervical back: Normal range of motion and neck supple.  Neurological:     General: No focal deficit present.     Mental Status: She is alert and oriented to person, place, and time.  Psychiatric:        Mood and Affect: Mood normal.        Behavior: Behavior normal.        Assessment & Plan:  1. Encounter to establish care (Primary) - Patient  presents today to establish care. During the interim follow-up with primary provider as scheduled.  - Return for annual physical examination, labs, and health maintenance. Arrive fasting meaning having no food for at least 8 hours prior to appointment. You may have only water or black coffee. Please take scheduled medications as normal.  2. Encounter for screening mammogram for malignant neoplasm of breast 3. Breast pain 4. Mass of right breast, unspecified quadrant 5. Family history of breast cancer - Routine screening.  - MM Digital Screening; Future - MM Digital Diagnostic Bilat; Future - US  BREAST COMPLETE UNI LEFT INC AXILLA; Future - US  BREAST COMPLETE UNI RIGHT INC AXILLA; Future  6. Asthma, unspecified asthma severity, unspecified whether complicated, unspecified whether persistent - Continue Albuterol inhaler as prescribed. Counseled on medication adherence/adverse effects.  - Referral  to Pulmonology for evaluation/management.  - Follow-up with primary provider as scheduled.  - albuterol (VENTOLIN HFA) 108 (90 Base) MCG/ACT inhaler; Inhale 2 puffs into the lungs every 6 (six) hours as needed for wheezing or shortness of breath.  Dispense: 18 g; Refill: 2 - Ambulatory referral to Pulmonology  7. Routine eye exam - Referral to Ophthalmology for evaluation/management. - Ambulatory referral to Ophthalmology  8. Weight gain - Patient declined pharmacological therapy.  - Referral to Medical Weight Management for evaluation/management. - Follow-up with primary provider as scheduled.  - Amb Ref to Medical Weight Management  9. Elevated blood pressure reading - Blood pressure not at goal during today's visit. Patient asymptomatic without chest pressure, chest pain, palpitations, shortness of breath, worst headache of life, and any additional red flag symptoms. - Follow-up with primary provider in 4 weeks or sooner if needed.  10. Immunization due - Administered. - Pneumococcal conjugate vaccine 20-valent (Prevnar 20) - Flu vaccine trivalent PF, 6mos and older(Flulaval,Afluria,Fluarix,Fluzone) - Varicella-zoster vaccine IM   Patient was given clear instructions to go to Emergency Department or return to medical center if symptoms don't improve, worsen, or new problems develop.The patient verbalized understanding.  I discussed the assessment and treatment plan with the patient. The patient was provided an opportunity to ask questions and all were answered. The patient agreed with the plan and demonstrated an understanding of the instructions.   The patient was advised to call back or seek an in-person evaluation if the symptoms worsen or if the condition fails to improve as anticipated.    Greig Chute, NP 04/13/2024, 11:22 AM Primary Care at [facility]

## 2024-05-09 ENCOUNTER — Other Ambulatory Visit

## 2024-05-09 ENCOUNTER — Encounter

## 2024-05-23 ENCOUNTER — Encounter: Admitting: Family

## 2024-05-23 NOTE — Progress Notes (Signed)
 Erroneous encounter-disregard

## 2024-06-06 ENCOUNTER — Ambulatory Visit
Admission: RE | Admit: 2024-06-06 | Discharge: 2024-06-06 | Disposition: A | Discharge status: Home / Self Care | Source: Ambulatory Visit | Attending: Family | Admitting: Family

## 2024-06-06 ENCOUNTER — Other Ambulatory Visit: Payer: Self-pay | Admitting: Family

## 2024-06-06 DIAGNOSIS — N6310 Unspecified lump in the right breast, unspecified quadrant: Secondary | ICD-10-CM

## 2024-06-06 DIAGNOSIS — N644 Mastodynia: Secondary | ICD-10-CM

## 2024-06-07 ENCOUNTER — Ambulatory Visit: Payer: Self-pay | Admitting: Family

## 2024-06-16 ENCOUNTER — Ambulatory Visit
Admission: RE | Admit: 2024-06-16 | Discharge: 2024-06-16 | Disposition: A | Source: Ambulatory Visit | Attending: Family | Admitting: Family

## 2024-06-16 ENCOUNTER — Ambulatory Visit: Payer: Self-pay | Admitting: Family

## 2024-06-16 DIAGNOSIS — N6310 Unspecified lump in the right breast, unspecified quadrant: Secondary | ICD-10-CM

## 2024-06-16 HISTORY — PX: BREAST BIOPSY: SHX20

## 2024-06-19 ENCOUNTER — Ambulatory Visit: Payer: Self-pay | Admitting: Family

## 2024-06-19 LAB — SURGICAL PATHOLOGY

## 2024-06-21 ENCOUNTER — Telehealth: Payer: Self-pay | Admitting: *Deleted

## 2024-06-21 NOTE — Telephone Encounter (Signed)
 Patient has voice mail not set up yet so unable to leave message, did email all appointment info will try back another day

## 2024-06-21 NOTE — Telephone Encounter (Signed)
 Spoke to patient to confirm upcoming afternoon [facility] clinic appointment on 1/21, paperwork will be sent via email  Gave location and time, also informed patient that the surgeon's office would be calling as well to get information from them similar to the packet that they will be receiving so make sure to do both.  Reminded patient that all providers will be coming to the clinic to see them HERE and if they had any questions to not hesitate to reach back out to myself or their navigators.

## 2024-06-26 ENCOUNTER — Encounter: Payer: Self-pay | Admitting: *Deleted

## 2024-06-26 DIAGNOSIS — C50411 Malignant neoplasm of upper-outer quadrant of right female breast: Secondary | ICD-10-CM | POA: Insufficient documentation

## 2024-06-27 NOTE — Progress Notes (Signed)
 " Radiation Oncology         [phone] ________________________________  Initial Outpatient Consultation  Name: Susan Trevino MRN: [id number]  Date: 06/28/2024  DOB: 30-Jun-1970  CC:Jaycee Greig PARAS, NP  Vernetta Berg, MD   REFERRING PHYSICIAN: Vernetta Berg, MD  DIAGNOSIS: No diagnosis found.   Cancer Staging  No matching staging information was found for the patient.  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 female who presented to medical attention last month (December 2025) with a palpable lump with associated tenderness in the medial aspect of the right breast, as well as some tenderness in the lateral aspect of the right breast. She accordingly presented for a bilateral diagnostic mammogram and bilateral breast ultrasound on 06/06/24 that demonstrated: a highly suspicious 1.1 cm mass in the 9 o'clock right breast located 8 cmfn. A group of benign cysts was also demonstrated in the 2 o'clock left breast. Imaging otherwise showed no evidence of axillary lymphadenopathy bilaterally.    Biopsy of the 9 o'clock right breast mass on date of 06/17/23 showed grade 3 invasive ductal carcinoma measuring 1 cm in the greatest linear extent of the sample, positive for LVI, along with high grade DCIS w/ necrosis.  ER status: 0% negative; PR status 0% negative; Her2 status negative; Proliferation marker Ki67 at 90%; Grade 3. No lymph nodes were examined.   ***  PREVIOUS RADIATION THERAPY: No  PAST MEDICAL HISTORY:  has a past medical history of Asthma, COPD (chronic obstructive pulmonary disease) (HCC), and Diabetes mellitus without complication (HCC).    PAST SURGICAL HISTORY: Past Surgical History:  Procedure Laterality Date   BREAST BIOPSY Right 06/16/2024   US  RT BREAST BX W LOC DEV 1ST  LESION IMG BX SPEC US  GUIDE 06/16/2024 [facility] M[facility]MO[facility]AP[facility]   CHOLECYSTECTOMY      FAMILY HISTORY: family history is not on file.  SOCIAL HISTORY:  reports that she has been smoking cigarettes. She does not have any smokeless tobacco history on file. She reports current alcohol use. She reports that she does not use drugs.  ALLERGIES: Tramadol  MEDICATIONS:  Current Outpatient Medications  Medication Sig Dispense Refill   albuterol  (VENTOLIN  HFA) 108 (90 Base) MCG/ACT inhaler Inhale 2 puffs into the lungs every 6 (six) hours as needed for wheezing or shortness of breath. 18 g 2   HYDROcodone -acetaminophen  (NORCO/VICODIN) 5-325 MG per tablet Take 1 tablet by mouth every 6 (six) hours as needed. (Patient not taking: Reported on 04/13/2024) 8 tablet 0   hydrOXYzine  (ATARAX /VISTARIL ) 25 MG tablet Take 1 tablet (25 mg total) by mouth every 6 (six) hours. (Patient not taking: Reported on 10/08/2014) 12 tablet 0   ibuprofen (ADVIL,MOTRIN) 200 MG tablet Take 800 mg by mouth every 6 (six) hours as needed (for pain.).     naproxen  (NAPROSYN ) 500 MG tablet Take one pill twice a day for fever and aches (Patient not taking: Reported on 10/08/2014) 20 tablet 0   ondansetron  (ZOFRAN  ODT) 8 MG disintegrating tablet Take 1 tablet (8 mg total) by mouth every 8 (eight) hours as needed for nausea or vomiting. (Patient not taking: Reported on 07/22/2014) 20 tablet 0   oxyCODONE -acetaminophen  (PERCOCET/ROXICET) 5-325 MG per tablet Take 2 tablets by mouth every 4 (four) hours as needed for moderate pain or severe pain. (Patient not  taking: Reported on 07/22/2014) 6 tablet 0   predniSONE  (DELTASONE ) 10 MG tablet 6,5,4,3,2,1 taper (Patient not taking: Reported on 08/04/2014) 21 tablet 0   promethazine  (PHENERGAN ) 25 MG tablet Take 1 tablet (25 mg total) by mouth every 6 (six) hours as needed for nausea or vomiting. (Patient not taking: Reported on 04/13/2024) 8 tablet 0   No current facility-administered medications for this  encounter.    REVIEW OF SYSTEMS: As above in HPI.   PHYSICAL EXAM:  vitals were not taken for this visit.   General: Alert and oriented, in no acute distress HEENT: Head is normocephalic. Extraocular movements are intact.  Heart: Regular in rate and rhythm with no murmurs, rubs, or gallops. Chest: Clear to auscultation bilaterally, with no rhonchi, wheezes, or rales. Abdomen: Soft, nontender, nondistended, with no rigidity or guarding. Extremities: No cyanosis or edema. Skin: No concerning lesions. Musculoskeletal: symmetric strength and muscle tone throughout. Neurologic: Cranial nerves II through XII are grossly intact. No obvious focalities. Speech is fluent. Coordination is intact. Psychiatric: Judgment and insight are intact. Affect is appropriate. Breasts: *** . No other palpable masses appreciated in the breasts or axillae *** .    ECOG = ***  0 - Asymptomatic (Fully active, able to carry on all predisease activities without restriction)  1 - Symptomatic but completely ambulatory (Restricted in physically strenuous activity but ambulatory and able to carry out work of a light or sedentary nature. For example, light housework, office work)  2 - Symptomatic, <50% in bed during the day (Ambulatory and capable of all self care but unable to carry out any work activities. Up and about more than 50% of waking hours)  3 - Symptomatic, >50% in bed, but not bedbound (Capable of only limited self-care, confined to bed or chair 50% or more of waking hours)  4 - Bedbound (Completely disabled. Cannot carry on any self-care. Totally confined to bed or chair)  5 - Death   Raylene MM, Creech RH, Tormey DC, et al. [phone]). Toxicity and response criteria of the [facility]. Am. DOROTHA Bridges. Oncol. 5 (6): 649-55   LABORATORY DATA:   CBC    Component Value Date/Time   WBC 6.1 10/08/2014 2008   RBC 4.59 10/08/2014 2008   HGB 13.4 10/08/2014 2008   HCT 42.6 10/08/2014  2008   PLT 240 10/08/2014 2008   MCV 92.8 10/08/2014 2008   MCH 29.2 10/08/2014 2008   MCHC 31.5 10/08/2014 2008   RDW 14.2 10/08/2014 2008   LYMPHSABS 2.1 10/08/2014 2008   MONOABS 0.5 10/08/2014 2008   EOSABS 0.3 10/08/2014 2008   BASOSABS 0.0 10/08/2014 2008    CMP     Component Value Date/Time   NA 137 10/08/2014 2008   K 4.0 10/08/2014 2008   CL 110 10/08/2014 2008   CO2 22 10/08/2014 2008   GLUCOSE 131 (H) 10/08/2014 2008   BUN 14 10/08/2014 2008   CREATININE 0.76 10/08/2014 2008   CALCIUM 8.8 (L) 10/08/2014 2008   PROT 7.2 10/08/2014 2008   ALBUMIN 4.0 10/08/2014 2008   AST 43 (H) 10/08/2014 2008   ALT 69 (H) 10/08/2014 2008   ALKPHOS 67 10/08/2014 2008   BILITOT 0.4 10/08/2014 2008   GFRNONAA >60 10/08/2014 2008      RADIOGRAPHY: US  RT BREAST BX W LOC DEV 1ST LESION IMG BX SPEC US  GUIDE Addendum Date: 06/19/2024 ADDENDUM REPORT: 06/19/2024 14:37 ADDENDUM: PATHOLOGY revealed: Breast, right, needle core biopsy, 9:00, 8 cmfn, ribbon- INVASIVE DUCTAL  CARCINOMA, DUCTAL CARCINOMA IN SITU, HIGH GRADE WITH NECROSIS- OVERALL GRADE: 3- LYMPHOVASCULAR INVASION: PRESENT- CANCER LENGTH: 1 CM- CALCIFICATIONS: NOT IDENTIFIED Pathology results are CONCORDANT with imaging findings, per Dr. Corean Salter. Pathology results and recommendations below were discussed with patient by telephone. Patient reported biopsy site doing well with no adverse symptoms, and only slight tenderness at the site. Post biopsy care instructions were reviewed, questions were answered and my direct phone number was provided. Patient was instructed to call [facility] for any additional questions or concerns related to biopsy site. RECOMMENDATION: Surgical and oncological consultation. The patient was referred to [facility] with appointment on 06/28/24. Pathology results reported by Mliss CHARM Molt RN 06/19/2024.  Electronically Signed   By: Corean Salter M.D.   On: 06/19/2024 14:37   Result Date: 06/19/2024 CLINICAL DATA:  Suspicious RIGHT breast mass EXAM: ULTRASOUND GUIDED RIGHT BREAST CORE NEEDLE BIOPSY COMPARISON:  Previous exam(s). PROCEDURE: I met with the patient and we discussed the procedure of ultrasound-guided biopsy, including benefits and alternatives. We discussed the high likelihood of a successful procedure. We discussed the risks of the procedure, including infection, bleeding, tissue injury, clip migration, and inadequate sampling. Informed written consent was given. The usual time-out protocol was performed immediately prior to the procedure. Lesion quadrant: Upper outer quadrant Using sterile technique and 1% lidocaine and 1% lidocaine with epinephrine as local anesthetic, under direct ultrasound visualization, a 14 gauge spring-loaded device was used to perform biopsy of a mass at 9 o'clock 8 cm from the nipple using a inferolateral approach. At the conclusion of the procedure a RIBBON shaped tissue marker clip was deployed into the biopsy cavity. Follow up 2 view mammogram was performed and dictated separately. IMPRESSION: Ultrasound guided biopsy of a RIGHT breast mass at 9 o'clock. No apparent complications. Electronically Signed: By: Corean Salter M.D. On: 06/16/2024 14:24   MM CLIP PLACEMENT RIGHT Result Date: 06/16/2024 CLINICAL DATA:  Status post ultrasound-guided biopsy EXAM: 3D DIAGNOSTIC RIGHT MAMMOGRAM POST ULTRASOUND BIOPSY COMPARISON:  Previous exam(s). ACR Breast Density Category a: The breasts are almost entirely fatty. FINDINGS: 3D Mammographic images were obtained following ultrasound guided biopsy of a mass at 9 o'clock. The RIBBON shaped biopsy marking clip is in expected position at the site of biopsy. IMPRESSION: Appropriate positioning of the RIBBON shaped biopsy marking clip at the site of biopsy in the outer breast. Final Assessment: Post Procedure Mammograms for  Marker Placement Electronically Signed   By: Corean Salter M.D.   On: 06/16/2024 14:24   MM 3D DIAGNOSTIC MAMMOGRAM BILATERAL BREAST Result Date: 06/06/2024 CLINICAL DATA:  Reported palpable lump with associated tenderness in the medial aspect of the right breast. Patient also has some tenderness in the lateral aspect of the right breast. EXAM: DIGITAL DIAGNOSTIC BILATERAL MAMMOGRAM WITH TOMOSYNTHESIS AND CAD; ULTRASOUND LEFT BREAST LIMITED; ULTRASOUND RIGHT BREAST LIMITED TECHNIQUE: Bilateral digital diagnostic mammography and breast tomosynthesis was performed. The images were evaluated with computer-aided detection. ; Targeted ultrasound examination of the left breast was performed.; Targeted ultrasound examination of the right breast was performed COMPARISON:  None. ACR Breast Density Category a: The breasts are almost entirely fatty. FINDINGS: In the lateral right breast, there is an irregular mass with associated distortion measuring 1.3 cm in size. In the lateral left breast, there is a low to equal density, partly circumscribed oval mass measuring up to 1.7 cm in size. There are no other masses, no  areas of significant asymmetry, no architectural distortion and no suspicious calcifications. No abnormality is noted in the medial aspect of the right breast in the area of the reported palpable lump/focal tenderness. On physical exam, no mass is palpated in the lateral aspect of the right breast. Patient is tender along the medial aspect of the right breast. Targeted right breast ultrasound is performed, showing a hypoechoic mass with partly irregular partly angulated margins in the in 9 o'clock position, 8 cm the nipple, measuring 1.1 x 0.8 x 0.9 cm. This corresponds to the irregular mass seen mammographically. In the medial aspect the right breast there is normal fibroglandular tissue. No mass or abnormality in the area of the reported palpable lump/focal pain. Imaging of the right axilla demonstrates  normal lymph nodes. No enlarged or abnormal nodes. Targeted left breast ultrasound is performed, showing a group of cysts/clustered cyst in the 2 o'clock position, 6 cm the nipple, measuring 2.0 x 0.7 x 1.1 cm in size, corresponding in size, shape and location to the mammographic mass. No solid mass or suspicious finding. IMPRESSION: 1. 1.1 cm highly suspicious mass in the 9 o'clock position of the right breast. Tissue sampling is indicated. No right axillary lymphadenopathy. 2. Benign group of cysts/cluster cysts in the left breast at 2 o'clock. No evidence of left breast malignancy. RECOMMENDATION: 1. Ultrasound-guided core needle biopsy of the right breast mass. This procedure was scheduled prior to the patient leaving the [facility]. I have discussed the findings and recommendations with the patient. If applicable, a reminder letter will be sent to the patient regarding the next appointment. BI-RADS CATEGORY  5: Highly suggestive of malignancy. Electronically Signed   By: Alm Parkins M.D.   On: 06/06/2024 14:46   US  LIMITED ULTRASOUND INCLUDING AXILLA RIGHT BREAST Result Date: 06/06/2024 CLINICAL DATA:  Reported palpable lump with associated tenderness in the medial aspect of the right breast. Patient also has some tenderness in the lateral aspect of the right breast. EXAM: DIGITAL DIAGNOSTIC BILATERAL MAMMOGRAM WITH TOMOSYNTHESIS AND CAD; ULTRASOUND LEFT BREAST LIMITED; ULTRASOUND RIGHT BREAST LIMITED TECHNIQUE: Bilateral digital diagnostic mammography and breast tomosynthesis was performed. The images were evaluated with computer-aided detection. ; Targeted ultrasound examination of the left breast was performed.; Targeted ultrasound examination of the right breast was performed COMPARISON:  None. ACR Breast Density Category a: The breasts are almost entirely fatty. FINDINGS: In the lateral right breast, there is an irregular mass with associated distortion measuring 1.3 cm in size. In the lateral  left breast, there is a low to equal density, partly circumscribed oval mass measuring up to 1.7 cm in size. There are no other masses, no areas of significant asymmetry, no architectural distortion and no suspicious calcifications. No abnormality is noted in the medial aspect of the right breast in the area of the reported palpable lump/focal tenderness. On physical exam, no mass is palpated in the lateral aspect of the right breast. Patient is tender along the medial aspect of the right breast. Targeted right breast ultrasound is performed, showing a hypoechoic mass with partly irregular partly angulated margins in the in 9 o'clock position, 8 cm the nipple, measuring 1.1 x 0.8 x 0.9 cm. This corresponds to the irregular mass seen mammographically. In the medial aspect the right breast there is normal fibroglandular tissue. No mass or abnormality in the area of the reported palpable lump/focal pain. Imaging of the right axilla demonstrates normal lymph nodes. No enlarged or abnormal nodes. Targeted left breast ultrasound is performed,  showing a group of cysts/clustered cyst in the 2 o'clock position, 6 cm the nipple, measuring 2.0 x 0.7 x 1.1 cm in size, corresponding in size, shape and location to the mammographic mass. No solid mass or suspicious finding. IMPRESSION: 1. 1.1 cm highly suspicious mass in the 9 o'clock position of the right breast. Tissue sampling is indicated. No right axillary lymphadenopathy. 2. Benign group of cysts/cluster cysts in the left breast at 2 o'clock. No evidence of left breast malignancy. RECOMMENDATION: 1. Ultrasound-guided core needle biopsy of the right breast mass. This procedure was scheduled prior to the patient leaving the [facility] Center. I have discussed the findings and recommendations with the patient. If applicable, a reminder letter will be sent to the patient regarding the next appointment. BI-RADS CATEGORY  5: Highly suggestive of malignancy. Electronically Signed   By:  Alm Parkins M.D.   On: 06/06/2024 14:46   US  LIMITED ULTRASOUND INCLUDING AXILLA LEFT BREAST  Result Date: 06/06/2024 CLINICAL DATA:  Reported palpable lump with associated tenderness in the medial aspect of the right breast. Patient also has some tenderness in the lateral aspect of the right breast. EXAM: DIGITAL DIAGNOSTIC BILATERAL MAMMOGRAM WITH TOMOSYNTHESIS AND CAD; ULTRASOUND LEFT BREAST LIMITED; ULTRASOUND RIGHT BREAST LIMITED TECHNIQUE: Bilateral digital diagnostic mammography and breast tomosynthesis was performed. The images were evaluated with computer-aided detection. ; Targeted ultrasound examination of the left breast was performed.; Targeted ultrasound examination of the right breast was performed COMPARISON:  None. ACR Breast Density Category a: The breasts are almost entirely fatty. FINDINGS: In the lateral right breast, there is an irregular mass with associated distortion measuring 1.3 cm in size. In the lateral left breast, there is a low to equal density, partly circumscribed oval mass measuring up to 1.7 cm in size. There are no other masses, no areas of significant asymmetry, no architectural distortion and no suspicious calcifications. No abnormality is noted in the medial aspect of the right breast in the area of the reported palpable lump/focal tenderness. On physical exam, no mass is palpated in the lateral aspect of the right breast. Patient is tender along the medial aspect of the right breast. Targeted right breast ultrasound is performed, showing a hypoechoic mass with partly irregular partly angulated margins in the in 9 o'clock position, 8 cm the nipple, measuring 1.1 x 0.8 x 0.9 cm. This corresponds to the irregular mass seen mammographically. In the medial aspect the right breast there is normal fibroglandular tissue. No mass or abnormality in the area of the reported palpable lump/focal pain. Imaging of the right axilla demonstrates normal lymph nodes. No enlarged or  abnormal nodes. Targeted left breast ultrasound is performed, showing a group of cysts/clustered cyst in the 2 o'clock position, 6 cm the nipple, measuring 2.0 x 0.7 x 1.1 cm in size, corresponding in size, shape and location to the mammographic mass. No solid mass or suspicious finding. IMPRESSION: 1. 1.1 cm highly suspicious mass in the 9 o'clock position of the right breast. Tissue sampling is indicated. No right axillary lymphadenopathy. 2. Benign group of cysts/cluster cysts in the left breast at 2 o'clock. No evidence of left breast malignancy. RECOMMENDATION: 1. Ultrasound-guided core needle biopsy of the right breast mass. This procedure was scheduled prior to the patient leaving the [facility]. I have discussed the findings and recommendations with the patient. If applicable, a reminder letter will be sent to the patient regarding the next appointment. BI-RADS CATEGORY  5: Highly suggestive of malignancy. Electronically Signed  By: Alm Parkins M.D.   On: 06/06/2024 14:46      IMPRESSION/PLAN: ***   It was a pleasure meeting the patient today. We discussed the risks, benefits, and side effects of radiotherapy. I recommend radiotherapy to the *** to reduce her risk of locoregional recurrence by 2/3.  We discussed that radiation would take approximately *** weeks to complete and that I would give the patient a few weeks to heal following surgery before starting treatment planning. *** If chemotherapy were to be given, this would precede radiotherapy. We spoke about acute effects including skin irritation and fatigue as well as much less common late effects including internal organ injury or irritation. We spoke about the latest technology that is used to minimize the risk of late effects for patients undergoing radiotherapy to the breast or chest wall. No guarantees of treatment were given. The patient is enthusiastic about proceeding with treatment. I look forward to participating in the patient's  care.  I will await her referral back to me for postoperative follow-up and eventual CT simulation/treatment planning.  On date of service, in total, I spent *** minutes on this encounter. Patient was seen in person.   __________________________________________   Lauraine Golden, MD  This document serves as a record of services personally performed by Lauraine Golden, MD. It was created on her behalf by Dorthy Fuse, a trained medical scribe. The creation of this record is based on the scribe's personal observations and the provider's statements to them. This document has been checked and approved by the attending provider.  "

## 2024-06-28 ENCOUNTER — Encounter: Payer: Self-pay | Admitting: General Practice

## 2024-06-28 ENCOUNTER — Encounter: Payer: Self-pay | Admitting: *Deleted

## 2024-06-28 ENCOUNTER — Inpatient Hospital Stay

## 2024-06-28 ENCOUNTER — Other Ambulatory Visit: Payer: Self-pay | Admitting: Surgery

## 2024-06-28 ENCOUNTER — Encounter: Payer: Self-pay | Admitting: Physical Therapy

## 2024-06-28 ENCOUNTER — Ambulatory Visit
Admission: RE | Admit: 2024-06-28 | Discharge: 2024-06-28 | Disposition: A | Discharge status: Home / Self Care | Source: Ambulatory Visit | Attending: Radiation Oncology | Admitting: Radiation Oncology

## 2024-06-28 ENCOUNTER — Ambulatory Visit: Attending: Surgery | Admitting: Physical Therapy

## 2024-06-28 ENCOUNTER — Inpatient Hospital Stay: Attending: Hematology | Admitting: Hematology

## 2024-06-28 ENCOUNTER — Other Ambulatory Visit: Payer: Self-pay

## 2024-06-28 VITALS — BP 183/83 | HR 73 | Temp 98.2°F | Resp 17 | Wt 267.6 lb

## 2024-06-28 DIAGNOSIS — C50411 Malignant neoplasm of upper-outer quadrant of right female breast: Secondary | ICD-10-CM

## 2024-06-28 DIAGNOSIS — R293 Abnormal posture: Secondary | ICD-10-CM | POA: Insufficient documentation

## 2024-06-28 DIAGNOSIS — Z171 Estrogen receptor negative status [ER-]: Secondary | ICD-10-CM | POA: Insufficient documentation

## 2024-06-28 DIAGNOSIS — Z853 Personal history of malignant neoplasm of breast: Secondary | ICD-10-CM

## 2024-06-28 DIAGNOSIS — Z803 Family history of malignant neoplasm of breast: Secondary | ICD-10-CM | POA: Insufficient documentation

## 2024-06-28 DIAGNOSIS — C50811 Malignant neoplasm of overlapping sites of right female breast: Secondary | ICD-10-CM | POA: Diagnosis not present

## 2024-06-28 LAB — CBC WITH DIFFERENTIAL (CANCER CENTER ONLY)
Abs Immature Granulocytes: 0.17 K/uL — ABNORMAL HIGH (ref 0.00–0.07)
Basophils Absolute: 0.1 K/uL (ref 0.0–0.1)
Basophils Relative: 1 %
Eosinophils Absolute: 0.4 K/uL (ref 0.0–0.5)
Eosinophils Relative: 3 %
HCT: 40.3 % (ref 36.0–46.0)
Hemoglobin: 13 g/dL (ref 12.0–15.0)
Immature Granulocytes: 1 %
Lymphocytes Relative: 24 %
Lymphs Abs: 2.9 K/uL (ref 0.7–4.0)
MCH: 29.5 pg (ref 26.0–34.0)
MCHC: 32.3 g/dL (ref 30.0–36.0)
MCV: 91.4 fL (ref 80.0–100.0)
Monocytes Absolute: 0.6 K/uL (ref 0.1–1.0)
Monocytes Relative: 5 %
Neutro Abs: 8.1 K/uL — ABNORMAL HIGH (ref 1.7–7.7)
Neutrophils Relative %: 66 %
Platelet Count: 308 K/uL (ref 150–400)
RBC: 4.41 MIL/uL (ref 3.87–5.11)
RDW: 13.5 % (ref 11.5–15.5)
WBC Count: 12.2 K/uL — ABNORMAL HIGH (ref 4.0–10.5)
nRBC: 0 % (ref 0.0–0.2)

## 2024-06-28 LAB — CMP (CANCER CENTER ONLY)
ALT: 56 U/L — ABNORMAL HIGH (ref 0–44)
AST: 33 U/L (ref 15–41)
Albumin: 4 g/dL (ref 3.5–5.0)
Alkaline Phosphatase: 98 U/L (ref 38–126)
Anion gap: 8 (ref 5–15)
BUN: 10 mg/dL (ref 6–20)
CO2: 30 mmol/L (ref 22–32)
Calcium: 9.3 mg/dL (ref 8.9–10.3)
Chloride: 103 mmol/L (ref 98–111)
Creatinine: 0.98 mg/dL (ref 0.44–1.00)
GFR, Estimated: >60 mL/min
Glucose, Bld: 135 mg/dL — ABNORMAL HIGH (ref 70–99)
Potassium: 4.3 mmol/L (ref 3.5–5.1)
Sodium: 141 mmol/L (ref 135–145)
Total Bilirubin: 0.4 mg/dL (ref 0.0–1.2)
Total Protein: 7 g/dL (ref 6.5–8.1)

## 2024-06-28 LAB — GENETIC SCREENING ORDER

## 2024-06-28 MED ORDER — NICOTINE 14 MG/24HR TD PT24: 14.0000 mg | MEDICATED_PATCH | Freq: Every day | TRANSDERMAL | 0 refills | Status: AC

## 2024-06-28 MED ORDER — BUPROPION HCL ER (SR) 150 MG PO TB12: ORAL_TABLET | ORAL | 2 refills | Status: AC

## 2024-06-28 MED ORDER — NICOTINE 21 MG/24HR TD PT24: 21.0000 mg | MEDICATED_PATCH | Freq: Every day | TRANSDERMAL | 2 refills | Status: AC

## 2024-06-28 MED ORDER — NICOTINE 7 MG/24HR TD PT24: 7.0000 mg | MEDICATED_PATCH | Freq: Every day | TRANSDERMAL | 0 refills | Status: AC

## 2024-06-28 NOTE — Progress Notes (Signed)
 " [facility]   [phone] Fax:([phone]) [phone]   Clinic New Consult Note   Patient Care Team: Jaycee Greig PARAS, NP as PCP - General (Nurse Practitioner) Tyree Nanetta SAILOR, RN as Oncology Nurse Navigator Vernetta Berg, MD as Consulting Physician (General Surgery) Lanny Callander, MD as Consulting Physician (Hematology) Izell Domino, MD as Attending Physician (Radiation Oncology) 06/28/2024  CHIEF COMPLAINTS/PURPOSE OF CONSULTATION:  Newly diagnosed right breast cancer  REFERRING PHYSICIAN: Breast center   Discussed the use of AI scribe software for clinical note transcription with the patient, who gave verbal consent to proceed.  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  She presented with a palpable right breast mass and intermittent breast pain about a month ago.  Mammogram showed a 1.1 x 0.9 x 0.8 cm mass at the 9 o'clock position of right breast, axilla was negative by ultrasound.  She underwent needle biopsy which showed grade 3 invasive ductal carcinoma, triple negative with associated lymphovascular invasion and DCIS, Ki-67 90%.  A separate prior area of breast concern resolved and was deemed benign. She now has localized pain at the biopsy site but no additional breast pain, skin changes, chest discomfort, gastrointestinal symptoms, new focal pain, fevers, chills, or sweats.  Family history is notable for breast cancer in her maternal grandmother and great grandmother.  She has COPD and asthma without current respiratory symptoms. She smokes about half a pack per day with a 30+ year history and is planning to quit, using Wellbutrin , albuterol  inhaler, and a nicotine  patch. She drinks alcohol infrequently. She notes chronic headaches without new neurological symptoms. She has had  weight loss and is interested in weight management.  Recent labs showed mildly abnormal but improving liver function tests and a mildly elevated white blood cell count.     MEDICAL HISTORY:  Past Medical History:  Diagnosis Date   Asthma    COPD (chronic obstructive pulmonary disease) (HCC)    Diabetes mellitus without complication (HCC)     SURGICAL HISTORY: Past Surgical History:  Procedure Laterality Date   BREAST BIOPSY Right 06/16/2024   US  RT BREAST BX W LOC DEV 1ST LESION IMG BX SPEC US  GUIDE 06/16/2024 [facility] MAMMOGRAPHY   CESAREAN SECTION     CHOLECYSTECTOMY     HERNIA REPAIR      SOCIAL HISTORY: Social History   Socioeconomic History   Marital status: Divorced    Spouse name: Not on file   Number of children: 3   Years of education: Not on file   Highest education level: Not on file  Occupational History   Not on file  Tobacco Use   Smoking status: Every Day    Current packs/day: 0.50    Average packs/day: 0.5 packs/day for 41.1 years (20.5 ttl pk-yrs)    Types: Cigarettes    Start date: 52   Smokeless tobacco: Not on file  Vaping Use   Vaping status: Never Used  Substance and Sexual Activity   Alcohol use: Yes    Comment: rarely   Drug use: No   Sexual activity: Not Currently  Other Topics Concern   Not on file  Social History Narrative   Not on file   Social Drivers of Health   Tobacco Use: High Risk (06/28/2024)   Received from [facility]   Patient History  Smoking Tobacco Use: Every Day    Smokeless Tobacco Use: Unknown    Passive Exposure: Not on file  Financial Resource Strain: High Risk (04/13/2024)   Overall Financial Resource Strain (CARDIA)    Difficulty of Paying Living Expenses: Hard  Food Insecurity: Food Insecurity Present (06/28/2024)   Epic    Worried About Programme Researcher, Broadcasting/film/video in the Last Year: Often true    Ran Out of Food in the Last Year: Sometimes true  Transportation Needs: Unmet Transportation  Needs (06/28/2024)   Epic    Lack of Transportation (Medical): Yes    Lack of Transportation (Non-Medical): Yes  Physical Activity: Inactive (04/13/2024)   Exercise Vital Sign    Days of Exercise per Week: 0 days    Minutes of Exercise per Session: 0 min  Stress: No Stress Concern Present (04/13/2024)   [employer] of Occupational Health - Occupational Stress Questionnaire    Feeling of Stress: Only a little  Social Connections: Socially Isolated (04/13/2024)   Social Connection and Isolation Panel    Frequency of Communication with Friends and Family: More than three times a week    Frequency of Social Gatherings with Friends and Family: More than three times a week    Attends Religious Services: Never    Database Administrator or Organizations: No    Attends Banker Meetings: Never    Marital Status: Divorced  Catering Manager Violence: Not At Risk (06/28/2024)   Epic    Fear of Current or Ex-Partner: No    Emotionally Abused: No    Physically Abused: No    Sexually Abused: No  Depression (PHQ2-9): Low Risk (06/28/2024)   Depression (PHQ2-9)    PHQ-2 Score: 4  Alcohol Screen: Low Risk (04/13/2024)   Alcohol Screen    Last Alcohol Screening Score (AUDIT): 1  Housing: Unknown (06/28/2024)   Received from [facility]   Epic    Unable to Pay for Housing in the Last Year: Not on file    Number of Times Moved in the Last Year: Not on file    At any time in the past 12 months, were you homeless or living in a shelter (including now)?: No  Recent Concern: Housing - High Risk (06/28/2024)   Epic    Unable to Pay for Housing in the Last Year: Yes    Number of Times Moved in the Last Year: 1    Homeless in the Last Year: Yes  Utilities: Not At Risk (12/28/2022)   Received from [facility]ities    Threatened with loss of utilities: No  Health Literacy: Adequate Health Literacy (04/13/2024)   B1300 Health Literacy    Frequency of need for  help with medical instructions: Never    FAMILY HISTORY: Family History  Problem Relation Age of Onset   Heart disease Mother    Heart attack Mother    Stroke Mother    Kidney disease Father    Breast cancer Maternal Great-grandmother 33   Breast cancer Maternal Great-grandmother 68       breast cancer    ALLERGIES:  is allergic to tramadol.  MEDICATIONS:  Current Outpatient Medications  Medication Sig Dispense Refill   albuterol  (VENTOLIN  HFA) 108 (90 Base) MCG/ACT inhaler Inhale 2 puffs into the lungs every 6 (six) hours as needed for wheezing or shortness of breath. 18 g 2   buPROPion  (WELLBUTRIN  SR) 150 MG 12 hr tablet Start one week before  quit date. Take 1 tab daily x 3 days, then 1 tab BID thereafter. 60 tablet 2   ibuprofen (ADVIL,MOTRIN) 200 MG tablet Take 800 mg by mouth every 6 (six) hours as needed (for pain.).     nicotine  (NICODERM CQ  - DOSED IN MG/24 HOURS) 14 mg/24hr patch Place 1 patch (14 mg total) onto the skin daily. Apply 21 mg patch daily x 6 wk, then 14mg  patch daily x 2 wk, then 7 mg patch daily x 2 wk 14 patch 0   nicotine  (NICODERM CQ  - DOSED IN MG/24 HOURS) 21 mg/24hr patch Place 1 patch (21 mg total) onto the skin daily. Apply 21 mg patch daily x 6 wk, then 14mg  patch daily x 2 wk, then 7 mg patch daily x 2 wk 14 patch 2   nicotine  (NICODERM CQ  - DOSED IN MG/24 HR) 7 mg/24hr patch Place 1 patch (7 mg total) onto the skin daily. Apply 21 mg patch daily x 6 wk, then 14mg  patch daily x 2 wk, then 7 mg patch daily x 2 wk 14 patch 0   No current facility-administered medications for this visit.    REVIEW OF SYSTEMS:   Constitutional: Denies fevers, chills or abnormal night sweats, (+) mild fatigue  Eyes: Denies blurriness of vision, double vision or watery eyes Ears, nose, mouth, throat, and face: Denies mucositis or sore throat Respiratory: Denies cough, dyspnea or wheezes Cardiovascular: Denies palpitation, chest discomfort or lower extremity  swelling Gastrointestinal:  Denies nausea, heartburn or change in bowel habits Skin: Denies abnormal skin rashes Lymphatics: Denies new lymphadenopathy or easy bruising Neurological:Denies numbness, tingling or new weaknesses Behavioral/Psych: Mood is stable, no new changes  All other systems were reviewed with the patient and are negative.  PHYSICAL EXAMINATION: ECOG PERFORMANCE STATUS: 1 - Symptomatic but completely ambulatory  Vitals:   06/28/24 1259 06/28/24 1300  BP: (!) 189/70 (!) 183/83  Pulse: 73   Resp: 17   Temp: 98.2 F (36.8 C)   SpO2: 97%    Filed Weights   06/28/24 1259  Weight: 267 lb 9.6 oz (121.4 kg)    GENERAL:alert, no distress and comfortable SKIN: skin color, texture, turgor are normal, no rashes or significant lesions EYES: normal, conjunctiva are pink and non-injected, sclera clear OROPHARYNX:no exudate, no erythema and lips, buccal mucosa, and tongue normal  NECK: supple, thyroid normal size, non-tender, without nodularity LYMPH:  no palpable lymphadenopathy in the cervical, axillary or inguinal LUNGS: clear to auscultation and percussion with normal breathing effort HEART: regular rate & rhythm and no murmurs and no lower extremity edema ABDOMEN:abdomen soft, non-tender and normal bowel sounds Musculoskeletal:no cyanosis of digits and no clubbing  PSYCH: alert & oriented x 3 with fluent speech NEURO: no focal motor/sensory deficits Breasts: Breast inspection showed them to be symmetrical with no nipple discharge. Small lump in right breast, approximately 1-1.5 cm. Palpation of the left breast and axilla revealed no obvious mass that I could appreciate.   Physical Exam   LABORATORY DATA:  I have reviewed the data as listed    Latest Ref Rng & Units 06/28/2024   11:55 AM 10/08/2014    8:08 PM 08/04/2014    1:55 PM  CBC  WBC 4.0 - 10.5 K/uL 12.2  6.1  8.0   Hemoglobin 12.0 - 15.0 g/dL 86.9  86.5  86.1   Hematocrit 36.0 - 46.0 % 40.3  42.6  41.5    Platelets 150 - 400 K/uL 308  240  130  Latest Ref Rng & Units 06/28/2024   11:55 AM 10/08/2014    8:08 PM 08/04/2014    1:55 PM  CMP  Glucose 70 - 99 mg/dL 864  868  890   BUN 6 - 20 mg/dL 10  14  10    Creatinine 0.44 - 1.00 mg/dL 9.01  9.23  8.97   Sodium 135 - 145 mmol/L 141  137  131   Potassium 3.5 - 5.1 mmol/L 4.3  4.0  3.6   Chloride 98 - 111 mmol/L 103  110  104   CO2 22 - 32 mmol/L 30  22  19    Calcium 8.9 - 10.3 mg/dL 9.3  8.8  8.1   Total Protein 6.5 - 8.1 g/dL 7.0  7.2  6.6   Total Bilirubin 0.0 - 1.2 mg/dL 0.4  0.4  0.9   Alkaline Phos 38 - 126 U/L 98  67  130   AST 15 - 41 U/L 33  43  937   ALT 0 - 44 U/L 56  69  909      RADIOGRAPHIC STUDIES: I have personally reviewed the radiological images as listed and agreed with the findings in the report. US  RT BREAST BX W LOC DEV 1ST LESION IMG BX SPEC US  GUIDE Addendum Date: 06/19/2024 ADDENDUM REPORT: 06/19/2024 14:37 ADDENDUM: PATHOLOGY revealed: Breast, right, needle core biopsy, 9:00, 8 cmfn, ribbon- INVASIVE DUCTAL CARCINOMA, DUCTAL CARCINOMA IN SITU, HIGH GRADE WITH NECROSIS- OVERALL GRADE: 3- LYMPHOVASCULAR INVASION: PRESENT- CANCER LENGTH: 1 CM- CALCIFICATIONS: NOT IDENTIFIED Pathology results are CONCORDANT with imaging findings, per Dr. Corean Salter. Pathology results and recommendations below were discussed with patient by telephone. Patient reported biopsy site doing well with no adverse symptoms, and only slight tenderness at the site. Post biopsy care instructions were reviewed, questions were answered and my direct phone number was provided. Patient was instructed to call [facility] for any additional questions or concerns related to biopsy site. RECOMMENDATION: Surgical and oncological consultation. The patient was referred to [facility] with appointment on 06/28/24. Pathology results reported by Mliss CHARM Molt RN 06/19/2024. Electronically Signed   By: Corean Salter M.D.   On: 06/19/2024 14:37   Result Date: 06/19/2024 CLINICAL DATA:  Suspicious RIGHT breast mass EXAM: ULTRASOUND GUIDED RIGHT BREAST CORE NEEDLE BIOPSY COMPARISON:  Previous exam(s). PROCEDURE: I met with the patient and we discussed the procedure of ultrasound-guided biopsy, including benefits and alternatives. We discussed the high likelihood of a successful procedure. We discussed the risks of the procedure, including infection, bleeding, tissue injury, clip migration, and inadequate sampling. Informed written consent was given. The usual time-out protocol was performed immediately prior to the procedure. Lesion quadrant: Upper outer quadrant Using sterile technique and 1% lidocaine and 1% lidocaine with epinephrine as local anesthetic, under direct ultrasound visualization, a 14 gauge spring-loaded device was used to perform biopsy of a mass at 9 o'clock 8 cm from the nipple using a inferolateral approach. At the conclusion of the procedure a RIBBON shaped tissue marker clip was deployed into the biopsy cavity. Follow up 2 view mammogram was performed and dictated separately. IMPRESSION: Ultrasound guided biopsy of a RIGHT breast mass at 9 o'clock. No apparent complications. Electronically Signed: By: Corean Salter M.D. On: 06/16/2024 14:24   MM CLIP PLACEMENT RIGHT Result Date: 06/16/2024 CLINICAL DATA:  Status post ultrasound-guided biopsy EXAM: 3D DIAGNOSTIC RIGHT MAMMOGRAM POST ULTRASOUND BIOPSY COMPARISON:  Previous  exam(s). ACR Breast Density Category a: The breasts are almost entirely fatty. FINDINGS: 3D Mammographic images were obtained following ultrasound guided biopsy of a mass at 9 o'clock. The RIBBON shaped biopsy marking clip is in expected position at the site of biopsy. IMPRESSION: Appropriate positioning of the RIBBON shaped biopsy marking clip at the site of biopsy in the outer breast. Final Assessment: Post  Procedure Mammograms for Marker Placement Electronically Signed   By: Corean Salter M.D.   On: 06/16/2024 14:24   MM 3D DIAGNOSTIC MAMMOGRAM BILATERAL BREAST Result Date: 06/06/2024 CLINICAL DATA:  Reported palpable lump with associated tenderness in the medial aspect of the right breast. Patient also has some tenderness in the lateral aspect of the right breast. EXAM: DIGITAL DIAGNOSTIC BILATERAL MAMMOGRAM WITH TOMOSYNTHESIS AND CAD; ULTRASOUND LEFT BREAST LIMITED; ULTRASOUND RIGHT BREAST LIMITED TECHNIQUE: Bilateral digital diagnostic mammography and breast tomosynthesis was performed. The images were evaluated with computer-aided detection. ; Targeted ultrasound examination of the left breast was performed.; Targeted ultrasound examination of the right breast was performed COMPARISON:  None. ACR Breast Density Category a: The breasts are almost entirely fatty. FINDINGS: In the lateral right breast, there is an irregular mass with associated distortion measuring 1.3 cm in size. In the lateral left breast, there is a low to equal density, partly circumscribed oval mass measuring up to 1.7 cm in size. There are no other masses, no areas of significant asymmetry, no architectural distortion and no suspicious calcifications. No abnormality is noted in the medial aspect of the right breast in the area of the reported palpable lump/focal tenderness. On physical exam, no mass is palpated in the lateral aspect of the right breast. Patient is tender along the medial aspect of the right breast. Targeted right breast ultrasound is performed, showing a hypoechoic mass with partly irregular partly angulated margins in the in 9 o'clock position, 8 cm the nipple, measuring 1.1 x 0.8 x 0.9 cm. This corresponds to the irregular mass seen mammographically. In the medial aspect the right breast there is normal fibroglandular tissue. No mass or abnormality in the area of the reported palpable lump/focal pain. Imaging of the  right axilla demonstrates normal lymph nodes. No enlarged or abnormal nodes. Targeted left breast ultrasound is performed, showing a group of cysts/clustered cyst in the 2 o'clock position, 6 cm the nipple, measuring 2.0 x 0.7 x 1.1 cm in size, corresponding in size, shape and location to the mammographic mass. No solid mass or suspicious finding. IMPRESSION: 1. 1.1 cm highly suspicious mass in the 9 o'clock position of the right breast. Tissue sampling is indicated. No right axillary lymphadenopathy. 2. Benign group of cysts/cluster cysts in the left breast at 2 o'clock. No evidence of left breast malignancy. RECOMMENDATION: 1. Ultrasound-guided core needle biopsy of the right breast mass. This procedure was scheduled prior to the patient leaving the Breast Center. I have discussed the findings and recommendations with the patient. If applicable, a reminder letter will be sent to the patient regarding the next appointment. BI-RADS CATEGORY  5: Highly suggestive of malignancy. Electronically Signed   By: Alm Parkins M.D.   On: 06/06/2024 14:46   US  LIMITED ULTRASOUND INCLUDING AXILLA RIGHT BREAST Result Date: 06/06/2024 CLINICAL DATA:  Reported palpable lump with associated tenderness in the medial aspect of the right breast. Patient also has some tenderness in the lateral aspect of the right breast. EXAM: DIGITAL DIAGNOSTIC BILATERAL MAMMOGRAM WITH TOMOSYNTHESIS AND CAD; ULTRASOUND LEFT BREAST LIMITED; ULTRASOUND RIGHT BREAST LIMITED TECHNIQUE: Bilateral  digital diagnostic mammography and breast tomosynthesis was performed. The images were evaluated with computer-aided detection. ; Targeted ultrasound examination of the left breast was performed.; Targeted ultrasound examination of the right breast was performed COMPARISON:  None. ACR Breast Density Category a: The breasts are almost entirely fatty. FINDINGS: In the lateral right breast, there is an irregular mass with associated distortion measuring 1.3 cm  in size. In the lateral left breast, there is a low to equal density, partly circumscribed oval mass measuring up to 1.7 cm in size. There are no other masses, no areas of significant asymmetry, no architectural distortion and no suspicious calcifications. No abnormality is noted in the medial aspect of the right breast in the area of the reported palpable lump/focal tenderness. On physical exam, no mass is palpated in the lateral aspect of the right breast. Patient is tender along the medial aspect of the right breast. Targeted right breast ultrasound is performed, showing a hypoechoic mass with partly irregular partly angulated margins in the in 9 o'clock position, 8 cm the nipple, measuring 1.1 x 0.8 x 0.9 cm. This corresponds to the irregular mass seen mammographically. In the medial aspect the right breast there is normal fibroglandular tissue. No mass or abnormality in the area of the reported palpable lump/focal pain. Imaging of the right axilla demonstrates normal lymph nodes. No enlarged or abnormal nodes. Targeted left breast ultrasound is performed, showing a group of cysts/clustered cyst in the 2 o'clock position, 6 cm the nipple, measuring 2.0 x 0.7 x 1.1 cm in size, corresponding in size, shape and location to the mammographic mass. No solid mass or suspicious finding. IMPRESSION: 1. 1.1 cm highly suspicious mass in the 9 o'clock position of the right breast. Tissue sampling is indicated. No right axillary lymphadenopathy. 2. Benign group of cysts/cluster cysts in the left breast at 2 o'clock. No evidence of left breast malignancy. RECOMMENDATION: 1. Ultrasound-guided core needle biopsy of the right breast mass. This procedure was scheduled prior to the patient leaving the Breast Center. I have discussed the findings and recommendations with the patient. If applicable, a reminder letter will be sent to the patient regarding the next appointment. BI-RADS CATEGORY  5: Highly suggestive of malignancy.  Electronically Signed   By: Alm Parkins M.D.   On: 06/06/2024 14:46   US  LIMITED ULTRASOUND INCLUDING AXILLA LEFT BREAST  Result Date: 06/06/2024 CLINICAL DATA:  Reported palpable lump with associated tenderness in the medial aspect of the right breast. Patient also has some tenderness in the lateral aspect of the right breast. EXAM: DIGITAL DIAGNOSTIC BILATERAL MAMMOGRAM WITH TOMOSYNTHESIS AND CAD; ULTRASOUND LEFT BREAST LIMITED; ULTRASOUND RIGHT BREAST LIMITED TECHNIQUE: Bilateral digital diagnostic mammography and breast tomosynthesis was performed. The images were evaluated with computer-aided detection. ; Targeted ultrasound examination of the left breast was performed.; Targeted ultrasound examination of the right breast was performed COMPARISON:  None. ACR Breast Density Category a: The breasts are almost entirely fatty. FINDINGS: In the lateral right breast, there is an irregular mass with associated distortion measuring 1.3 cm in size. In the lateral left breast, there is a low to equal density, partly circumscribed oval mass measuring up to 1.7 cm in size. There are no other masses, no areas of significant asymmetry, no architectural distortion and no suspicious calcifications. No abnormality is noted in the medial aspect of the right breast in the area of the reported palpable lump/focal tenderness. On physical exam, no mass is palpated in the lateral aspect of the right breast.  Patient is tender along the medial aspect of the right breast. Targeted right breast ultrasound is performed, showing a hypoechoic mass with partly irregular partly angulated margins in the in 9 o'clock position, 8 cm the nipple, measuring 1.1 x 0.8 x 0.9 cm. This corresponds to the irregular mass seen mammographically. In the medial aspect the right breast there is normal fibroglandular tissue. No mass or abnormality in the area of the reported palpable lump/focal pain. Imaging of the right axilla demonstrates normal lymph  nodes. No enlarged or abnormal nodes. Targeted left breast ultrasound is performed, showing a group of cysts/clustered cyst in the 2 o'clock position, 6 cm the nipple, measuring 2.0 x 0.7 x 1.1 cm in size, corresponding in size, shape and location to the mammographic mass. No solid mass or suspicious finding. IMPRESSION: 1. 1.1 cm highly suspicious mass in the 9 o'clock position of the right breast. Tissue sampling is indicated. No right axillary lymphadenopathy. 2. Benign group of cysts/cluster cysts in the left breast at 2 o'clock. No evidence of left breast malignancy. RECOMMENDATION: 1. Ultrasound-guided core needle biopsy of the right breast mass. This procedure was scheduled prior to the patient leaving the Breast Center. I have discussed the findings and recommendations with the patient. If applicable, a reminder letter will be sent to the patient regarding the next appointment. BI-RADS CATEGORY  5: Highly suggestive of malignancy. Electronically Signed   By: Alm Parkins M.D.   On: 06/06/2024 14:46    Assessment & Plan Triple negative invasive ductal carcinoma of the right breast Newly diagnosed, early stage, high grade (grade 3) triple negative invasive ductal carcinoma of the right breast, measuring 1.1 cm, with associated DCIS and lymphovascular invasion. Lymph nodes are negative on imaging, and there is no clinical concern of metastatic disease. Tumor is ER, PR, and HER2 negative, indicating an aggressive phenotype not amenable to endocrine or HER2-targeted therapies.  -Adjuvant chemotherapy is indicated due to high risk of recurrence. She is clinically stable with no acute laboratory concerns; recent labs show mildly abnormal liver function tests (improved from prior) and slightly elevated white count, likely related to tobacco use. - She was seen by breast surgeon Dr. Vernetta today, plan to have surgical resection of the right breast tumor with port placement for chemotherapy  administration. - Planned adjuvant chemotherapy with docetaxel and cyclophosphamide (TC regimen) every three weeks for four cycles, to begin approximately one month postoperatively, contingent on adequate surgical recovery. - Port placement discussed to minimize chemotherapy-related venous irritation; port to be removed after chemotherapy completion. - Reviewed moderate intensity chemotherapy adverse effects, including nausea, anorexia, diarrhea, and alopecia; offered DigniCap cold cap for hair preservation, noting possible insurance coverage. - Planned adjuvant radiation therapy following chemotherapy. - Arranged long-term oncology follow-up and surveillance post-treatment. - Discussed genetic testing;  - Discussed potential research participation, including the [employer] study and possible future studies during treatment. - Planned lung cancer screening after completion of breast cancer therapy due to significant tobacco use history. - Provided dietary and lifestyle counseling for weight management, including low-carbohydrate diet, avoidance of sweets and soft drinks, and encouragement of physical activity.  Plan - Imaging and biopsy results reviewed with patient - Will proceed with right lumpectomy and sentinel lymph node biopsy, and port placement soon - I will see her back in 2 weeks after surgery to finalize her adjuvant chemotherapy - Genetic testing - Clinical trial options reviewed with her   No orders of the defined types were placed in this encounter.  All questions were answered. The patient knows to call the clinic with any problems, questions or concerns. I spent 50 minutes counseling the patient face to face. The total time spent in the appointment was 60 minutes including review of chart and various tests results, discussions about plan of care and coordination of care plan.     Onita Mattock, MD 06/28/2024   ' "

## 2024-06-28 NOTE — Therapy (Signed)
 " OUTPATIENT PHYSICAL THERAPY BREAST CANCER BASELINE EVALUATION   Patient Name: Susan Trevino MRN: [id number] DOB:02/19/1971, 54 y.o., female Today's Date: 06/28/2024  END OF SESSION:  PT End of Session - 06/28/24 1540     Visit Number 1    Number of Visits 2    Date for Recertification  08/23/24    PT Start Time 1454    PT Stop Time 1522    PT Time Calculation (min) 28 min    Activity Tolerance Patient tolerated treatment well    Behavior During Therapy [facility] for tasks assessed/performed          Past Medical History:  Diagnosis Date   Asthma    COPD (chronic obstructive pulmonary disease) (HCC)    Diabetes mellitus without complication (HCC)    Past Surgical History:  Procedure Laterality Date   BREAST BIOPSY Right 06/16/2024   US  RT BREAST BX W LOC DEV 1ST LESION IMG BX SPEC US  GUIDE 06/16/2024 [facility] MAMMOGRAPHY   CESAREAN SECTION     CHOLECYSTECTOMY     HERNIA REPAIR     Patient Active Problem List   Diagnosis Date Noted   Malignant neoplasm of upper-outer quadrant of right breast in female, estrogen receptor negative (HCC) 06/26/2024    REFERRING PROVIDER: Dr. Vicenta Poli  REFERRING DIAG: Right breast cancer  THERAPY DIAG:  Malignant neoplasm of upper-outer quadrant of right breast in female, estrogen receptor negative (HCC)  Abnormal posture  Rationale for Evaluation and Treatment: Rehabilitation  ONSET DATE: 06/06/2024  SUBJECTIVE:                                                                                                                                                                                           SUBJECTIVE STATEMENT: Patient reports she is here today to be seen by her medical team for her newly diagnosed right breast cancer.   PERTINENT HISTORY:  Patient was diagnosed on 06/06/2024 with right grade 3 invasive ductal carcinoma breast cancer. It measures 1.1 cm and is located in the upper outer quadrant. It is triple negative  with a Ki67 of 90%.   PATIENT GOALS:   reduce lymphedema risk and learn post op HEP.   PAIN:  Are you having pain? No  PRECAUTIONS: Active CA   RED FLAGS: None   HAND DOMINANCE: right  WEIGHT BEARING RESTRICTIONS: No  FALLS:  Has patient fallen in last 6 months? No  LIVING ENVIRONMENT: Patient lives with: her adult daughter and son-in-law Lives in: House/apartment Has following equipment at home: None  OCCUPATION: Unemployed  LEISURE: She does not exercise  PRIOR LEVEL OF FUNCTION: Independent  OBJECTIVE: Note: Objective measures were completed at Evaluation unless otherwise noted.  COGNITION: Overall cognitive status: Within functional limits for tasks assessed    POSTURE:  Forward head and rounded shoulders posture  UPPER EXTREMITY AROM/PROM:  A/PROM RIGHT   eval   Shoulder extension 51  Shoulder flexion 146  Shoulder abduction 141  Shoulder internal rotation 48  Shoulder external rotation 88    (Blank rows = not tested)  A/PROM LEFT   eval  Shoulder extension 40  Shoulder flexion 126  Shoulder abduction 126  Shoulder internal rotation 58  Shoulder external rotation 76    (Blank rows = not tested)  CERVICAL AROM: All within normal limits  UPPER EXTREMITY STRENGTH: WFL  LYMPHEDEMA ASSESSMENTS (in cm):   LANDMARK RIGHT   eval  10 cm proximal to olecranon process from proximal aspect of olecranon 40.2  Olecranon process 28.9  10 cm proximal to ulnar styloid process from proximal aspect of styloid process 25.6  Just distal to ulnar styloid process 17.8  Across hand at thumb web space 20  At base of 2nd digit 6.8  (Blank rows = not tested)  LANDMARK LEFT   eval  10 cm proximal to olecranon process from proximal aspect of olecranon 41.8   Olecranon process 30.3  10 cm proximal to ulnar styloid process from proximal aspect of styloid process 24.8  Just distal to ulnar styloid process 17.3  Across hand at thumb web space 20.2  At base of  2nd digit 6.4  (Blank rows = not tested)  L-DEX LYMPHEDEMA SCREENING:  The patient was assessed using the L-Dex machine today to produce a lymphedema index baseline score. The patient will be reassessed on a regular basis (typically every 3 months) to obtain new L-Dex scores. If the score is > 6.5 points away from his/her baseline score indicating onset of subclinical lymphedema, it will be recommended to wear a compression garment for 4 weeks, 12 hours per day and then be reassessed. If the score continues to be > 6.5 points from baseline at reassessment, we will initiate lymphedema treatment. Assessing in this manner has a 95% rate of preventing clinically significant lymphedema.   L-DEX FLOWSHEETS - 06/28/24 1500       L-DEX LYMPHEDEMA SCREENING   Measurement Type Unilateral    L-DEX MEASUREMENT EXTREMITY Upper Extremity    POSITION  Standing    DOMINANT SIDE Right    At Risk Side Right    BASELINE SCORE (UNILATERAL) -0.8          QUICK DASH SURVEY:  Susan Trevino - 06/28/24 0001     Open a tight or new jar No difficulty    Do heavy household chores (wash walls, wash floors) Mild difficulty    Carry a shopping bag or briefcase No difficulty    Wash your back Moderate difficulty    Use a knife to cut food No difficulty    Recreational activities in which you take some force or impact through your arm, shoulder, or hand (golf, hammering, tennis) No difficulty    During the past week, to what extent has your arm, shoulder or hand problem interfered with your normal social activities with family, friends, neighbors, or groups? Not at all    During the past week, to what extent has your arm, shoulder or hand problem limited your work or other regular daily activities Not at all    Arm, shoulder, or hand pain. None    Tingling (pins and needles) in  your arm, shoulder, or hand None    Difficulty Sleeping No difficulty    DASH Score 6.82 %           PATIENT EDUCATION:  Education  details: Time spent educating patient on aspects of self-care to maximize post op recovery. Patient was educated on where and how to get a post op compression bra to use to reduce post op edema. Patient was also educated on the use of SOZO screenings and surveillance principles for early identification of lymphedema onset. She was instructed to use the post op pillow in the axilla for pressure and pain relief. Patient educated on lymphedema risk reduction and post op shoulder/posture HEP. Person educated: Patient Education method: Explanation, Demonstration, Handout Education comprehension: Patient verbalized understanding and returned demonstration  HOME EXERCISE PROGRAM: Patient was instructed today in a home exercise program today for post op shoulder range of motion. These included active assist shoulder flexion in sitting, scapular retraction, wall walking with shoulder abduction, and hands behind head external rotation.  She was encouraged to do these twice a day, holding 3 seconds and repeating 5 times when permitted by her physician.   ASSESSMENT:  CLINICAL IMPRESSION: Patient was diagnosed on 06/06/2024 with right grade 3 invasive ductal carcinoma breast cancer. It measures 1.1 cm and is located in the upper outer quadrant. It is triple negative with a Ki67 of 90%. Her multidisciplinary medical team met prior to her assessments to determine a recommended treatment plan. She is planning to have a right lumpectomy and sentinel node biopsy followed by chemotherapy and radiation. She will benefit from a post op PT reassessment to determine needs and from L-Dex screens every 3 months for 2 years to detect subclinical lymphedema.  Pt will benefit from skilled therapeutic intervention to improve on the following deficits: Decreased knowledge of precautions, impaired UE functional use, pain, decreased ROM, postural dysfunction.   PT treatment/interventions: ADL/self-care home management, pt/family  education, therapeutic exercise  REHAB POTENTIAL: Excellent  CLINICAL DECISION MAKING: Stable/uncomplicated  EVALUATION COMPLEXITY: Low   GOALS: Goals reviewed with patient? YES  LONG TERM GOALS: (STG=LTG)    Name Target Date Goal status  1 Pt will be able to verbalize understanding of pertinent lymphedema risk reduction practices relevant to her dx specifically related to skin care.  Baseline:  No knowledge 06/28/2024 Achieved at eval  2 Pt will be able to return demo and/or verbalize understanding of the post op HEP related to regaining shoulder ROM. Baseline:  No knowledge 06/28/2024 Achieved at eval  3 Pt will be able to verbalize understanding of the importance of viewing the post op After Breast CA Class video for further lymphedema risk reduction education and therapeutic exercise.  Baseline:  No knowledge 06/28/2024 Achieved at eval  4 Pt will demo she has regained full shoulder ROM and function post operatively compared to baselines.  Baseline: See objective measurements taken today. 08/23/2024     PLAN:  PT FREQUENCY/DURATION: EVAL and 1 follow up appointment.   PLAN FOR NEXT SESSION: will reassess 3-4 weeks post op to determine needs.   Patient will follow up at outpatient cancer rehab 3-4 weeks following surgery.  If the patient requires physical therapy at that time, a specific plan will be dictated and sent to the referring physician for approval. The patient was educated today on appropriate basic range of motion exercises to begin post operatively and the importance of viewing the After Breast Cancer class video following surgery.  Patient was educated today on  lymphedema risk reduction practices as it pertains to recommendations that will benefit the patient immediately following surgery.  She verbalized good understanding.    Physical Therapy Information for After Breast Cancer Surgery/Treatment:  Lymphedema is a swelling condition that you may be at risk for in your  arm if you have lymph nodes removed from the armpit area.  After a sentinel node biopsy, the risk is approximately 5-9% and is higher after an axillary node dissection.  There is treatment available for this condition and it is not life-threatening.  Contact your physician or physical therapist with concerns. You may begin the 4 shoulder/posture exercises (see additional sheet) when permitted by your physician (typically a week after surgery).  If you have drains, you may need to wait until those are removed before beginning range of motion exercises.  A general recommendation is to not lift your arms above shoulder height until drains are removed.  These exercises should be done to your tolerance and gently.  This is not a no pain/no gain type of recovery so listen to your body and stretch into the range of motion that you can tolerate, stopping if you have pain.  If you are having immediate reconstruction, ask your plastic surgeon about doing exercises as he or she may want you to wait. We encourage you to view the After Breast Cancer class video following surgery.  You will learn information related to lymphedema risk, prevention and treatment and additional exercises to regain mobility following surgery.   While undergoing any medical procedure or treatment, try to avoid blood pressure being taken or needle sticks from occurring on the arm on the side of cancer.   This recommendation begins after surgery and continues for the rest of your life.  This may help reduce your risk of getting lymphedema (swelling in your arm). An excellent resource for those seeking information on lymphedema is the National Lymphedema Network's web site. It can be accessed at www.lymphnet.org If you notice swelling in your hand, arm or breast at any time following surgery (even if it is many years from now), please contact your doctor or physical therapist to discuss this.  Lymphedema can be treated at any time but it is easier  for you if it is treated early on.  If you feel like your shoulder motion is not returning to normal in a reasonable amount of time, please contact your surgeon or physical therapist.  [facility] [id number]. [address], [address], [place] [address]  [facility] CLASS After Breast Cancer Class  After Breast Cancer Class is a specially designed exercise class video to assist you in a safe recover after having breast cancer surgery.  In this video you will learn how to get back to full function whether your drains were just removed or if you had surgery a month ago. The video can be viewed on this page: https://www.boyd-meyer.org/ or on YouTube here: https://youtu.az/[email or web address].  Class Goals  Understand specific stretches to improve the flexibility of you chest and shoulder. Learn ways to safely strengthen your upper body and improve your posture. Understand the warning signs of infection and why you may be at risk for an arm infection. Learn about Lymphedema and prevention.  ** You do not need to view this video until after surgery.  Drains should be removed to participate in the recommended exercises on the video.  Patient was instructed today in a home exercise program today for post op shoulder range of  motion. These included active assist shoulder flexion in sitting, scapular retraction, wall walking with shoulder abduction, and hands behind head external rotation.  She was encouraged to do these twice a day, holding 3 seconds and repeating 5 times when permitted by her physician.  Eward Wonda Sharps, [place] 06/28/24 3:46 PM  "

## 2024-06-28 NOTE — Research (Signed)
 Trial:  [employer] 2021-05 - Specimen Collection Study to Evaluate Biomarkers in Subjects with Cancer   Patient Susan Trevino was identified by Dr. Lanny as a potential candidate for the above listed study.  This Clinical Research Nurse met with Burnard CHRISTELLA Schlossman, [id number], on 06/28/24 in a manner and location that ensures patient privacy to discuss participation in the above listed research study.  Patient is Accompanied by her aunt [name].  A copy of the informed consent document with embedded HIPAA language was provided to the patient.  Patient reads, speaks, and understands English.   Patient was provided with the business card of this Nurse and encouraged to contact the research team with any questions.  Approximately 10 minutes were spent with the patient reviewing the informed consent documents.  Patient was provided the option of taking informed consent documents home to review and was encouraged to review at their convenience with their support network, including other care providers. Patient took the consent documents home to review. Patient agreed to a member of the research team calling her on Monday afternoon 07/03/24 to follow up.  Cherylyn Hoard, BSN, RN, [employer] Research Nurse II [phone] 06/28/2024 3:31 PM

## 2024-06-28 NOTE — Progress Notes (Signed)
 [facility] Spiritual Care Note  Met with Clancy and her aunt [name] in Breast Multidisciplinary Clinic to introduce Support Center team/resources.  Ms Vantil completed SDOH screening; results follow below.  SDOH Interventions    Flowsheet Row Office Visit from 04/13/2024 in [facility]  SDOH Interventions   Food Insecurity Interventions Intervention Not Indicated  Housing Interventions Intervention Not Indicated, Patient Declined  [patient lives with her daughter now]  Transportation Interventions Intervention Not Indicated  Alcohol Usage Interventions Intervention Not Indicated (Score <7)  Financial Strain Interventions Intervention Not Indicated  Physical Activity Interventions Intervention Not Indicated  Stress Interventions Intervention Not Indicated  Social Connections Interventions Intervention Not Indicated  Health Literacy Interventions Intervention Not Indicated    SDOH Screenings   Food Insecurity: Food Insecurity Present (06/28/2024)  Housing: High Risk (06/28/2024)  Transportation Needs: Unmet Transportation Needs (06/28/2024)  Utilities: Not At Risk (12/28/2022)   Received from [facility]  Alcohol Screen: Low Risk (04/13/2024)  Depression (PHQ2-9): Low Risk (06/28/2024)  Financial Resource Strain: High Risk (04/13/2024)  Physical Activity: Inactive (04/13/2024)  Social Connections: Socially Isolated (04/13/2024)  Stress: No Stress Concern Present (04/13/2024)  Tobacco Use: High Risk (06/28/2024)  Health Literacy: Adequate Health Literacy (04/13/2024)   Chaplain and patient discussed common feelings and emotions when being diagnosed with cancer, and the importance of support during treatment.  Chaplain informed patient of the support team and support services at [facility].  Chaplain provided contact information and encouraged patient to call with any questions or concerns. Ms Coaxum lives with her daughter and dedicates much of her  time to caring for her three grandchildren with special needs.  Placing Social Work referral due to financial concerns/SDOH indictors.  Follow up needed: Yes.  Plan to follow up by phone in ca two weeks, and Ms Colasurdo knows to contact Spiritual Care directly in the meantime as needed.   [address] [place], [place], [facility] Pager [phone] Voicemail [phone]

## 2024-06-28 NOTE — Progress Notes (Addendum)
 REFERRING PROVIDER: Vernetta Berg, MD [address] [place],  [place] [address]  PRIMARY PROVIDER:  Jaycee Greig PARAS, NP  PRIMARY REASON FOR VISIT:  1. Malignant neoplasm of upper-outer quadrant of right breast in female, estrogen receptor negative (HCC)    HISTORY OF PRESENT ILLNESS:   Susan Trevino, a 54 y.o. female, was seen for a [facility] cancer genetics consultation at the request of Susan Vernetta, MD due to a personal history of breast cancer.  Susan Trevino presents today the at the [facility] to discuss the possibility of a hereditary predisposition to cancer, genetic testing, and to further clarify her future cancer risks, as well as potential cancer risks for family members.  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  CANCER HISTORY:  Oncology History  Malignant neoplasm of upper-outer quadrant of right breast in female, estrogen receptor negative (HCC)  06/16/2024 Cancer Staging   Staging form: Breast, AJCC 8th Edition - Clinical stage from 06/16/2024: Stage IB (cT1c, cN0, cM0, G3, ER-, PR-, HER2-) - Signed by Lanny Callander, MD on 06/27/2024 Stage prefix: Initial diagnosis Histologic grading system: 3 grade system   06/26/2024 Initial Diagnosis   Malignant neoplasm of upper-outer quadrant of right breast in female, estrogen receptor negative (HCC)    RISK FACTORS:  Menarche was at age 26.  First live birth at age 44.  OCP use for approximately 6 years.  Menopausal status: postmenopausal.  HRT use: 0 years. Colonoscopy: no Mammogram within the last year: yes. Number of breast biopsies: 1. Up to date with pelvic exams: no. Tobacco Use: Current  Past Medical History:  Diagnosis Date   Asthma    COPD (chronic obstructive pulmonary disease) (HCC)    Diabetes mellitus without complication (HCC)    Past Surgical  History:  Procedure Laterality Date   BREAST BIOPSY Right 06/16/2024   US  RT BREAST BX W LOC DEV 1ST LESION IMG BX SPEC US  GUIDE 06/16/2024 [facility] MAMMOGRAPHY   CESAREAN SECTION     CHOLECYSTECTOMY     HERNIA REPAIR     Social History   Socioeconomic History   Marital status: Divorced    Spouse name: Not on file   Number of children: 3   Years of education: Not on file   Highest education level: Not on file  Occupational History   Not on file  Tobacco Use   Smoking status: Every Day    Current packs/day: 0.50    Average packs/day: 0.5 packs/day for 41.1 years (20.5 ttl pk-yrs)    Types: Cigarettes    Start date: 25   Smokeless tobacco: Not on file  Vaping Use   Vaping status: Never Used  Substance and Sexual Activity   Alcohol use: Yes    Comment: rarely   Drug use: No   Sexual activity: Not Currently  Other Topics Concern   Not on file  Social History Narrative   Not on file   Social Drivers of Health   Tobacco Use: High Risk (06/28/2024)   Patient History    Smoking Tobacco Use: Every Day    Smokeless Tobacco Use: Unknown    Passive Exposure: Not on file  Financial Resource Strain: High Risk (04/13/2024)   Overall Financial Resource Strain (CARDIA)    Difficulty of Paying Living Expenses: Hard  Food Insecurity: No Food Insecurity (04/13/2024)   Epic  Worried About Programme Researcher, Broadcasting/film/video in the Last Year: Never true    Ran Out of Food in the Last Year: Never true  Transportation Needs: Unknown (04/13/2024)   Epic    Lack of Transportation (Medical): No    Lack of Transportation (Non-Medical): Not on file  Physical Activity: Inactive (04/13/2024)   Exercise Vital Sign    Days of Exercise per Week: 0 days    Minutes of Exercise per Session: 0 min  Stress: No Stress Concern Present (04/13/2024)   [employer] of Occupational Health - Occupational Stress Questionnaire    Feeling of Stress: Only a little  Social Connections: Socially Isolated (04/13/2024)    Social Connection and Isolation Panel    Frequency of Communication with Friends and Family: More than three times a week    Frequency of Social Gatherings with Friends and Family: More than three times a week    Attends Religious Services: Never    Database Administrator or Organizations: No    Attends Banker Meetings: Never    Marital Status: Divorced  Depression (PHQ2-9): Low Risk (04/13/2024)   Depression (PHQ2-9)    PHQ-2 Score: 3  Alcohol Screen: Low Risk (04/13/2024)   Alcohol Screen    Last Alcohol Screening Score (AUDIT): 1  Housing: High Risk (04/13/2024)   Epic    Unable to Pay for Housing in the Last Year: No    Number of Times Moved in the Last Year: Not on file    Homeless in the Last Year: Yes  Utilities: Not At Risk (12/28/2022)   Received from [facility] Utilities    Threatened with loss of utilities: No  Health Literacy: Adequate Health Literacy (04/13/2024)   B1300 Health Literacy    Frequency of need for help with medical instructions: Never    FAMILY HISTORY:  We obtained a detailed, 4-generation family history pasted below.   Pedigree Summary Mother - Benign Breast Tumors/Masses 2x Maternal Aunts - Benign Breast Masses Maternal Uncle - Throat Cancer Maternal Grandmother - Breast Cancer Maternal Great grandmother - Breast Cancers Sister of Maternal Grandmother - Breast Cancer    Significant diagnoses are listed below: Family History  Problem Relation Age of Onset   Heart disease Mother    Heart attack Mother    Stroke Mother    Kidney disease Father    Breast cancer Maternal Great-grandmother 99   Breast cancer Maternal Great-grandmother 41       breast cancer    GENETIC COUNSELING ASSESSMENT: Susan Trevino is a 54 y.o. female with a personal and family history of breast cancer which is somewhat suggestive of a hereditary cancer predisposition syndrome. We, therefore, discussed and recommended the following at today's visit.    DISCUSSION: We discussed that, in general, most cancer is not inherited in families, but instead is sporadic or familial. Sporadic cancers occur by chance and typically happen at older ages (>50 years) as this type of cancer is caused by genetic changes acquired during an individuals lifetime. Some families have more cancers than would be expected by chance; however, the ages or types of cancer are not consistent with a known genetic mutation or known genetic mutations have been ruled out. This type of familial cancer is thought to be due to a combination of multiple genetic, environmental, hormonal, and lifestyle factors. While this combination of factors likely increases the risk of cancer, the exact source of this risk is not currently identifiable or testable.  We discussed that 5-10% of cancer is the result of germline (heritable) genetic variants, with most cases associated with BRCA1/BRCA2. There are other genes that can be associated with hereditary  cancer syndromes. We discussed that testing is beneficial for several reasons including knowing how to follow individuals after completing their treatment, identifying whether potential treatment options such as PARP inhibitors would be beneficial, and understanding if other family members could be at risk for cancer and allow them to undergo genetic testing.   We reviewed the characteristics, features and inheritance patterns of hereditary cancer syndromes. We also discussed genetic testing, including the appropriate family members to test, the process of testing, insurance coverage and turn-around-time for results. We discussed the implications of a negative, positive, carrier and/or variant of uncertain significant result. Susan Trevino  was offered a common hereditary cancer panel (40 genes) and an expanded pan-cancer panel (77 genes). Susan Trevino was informed of the benefits and limitations of each panel, including that expanded pan-cancer  panels contain genes that do not have clear management guidelines at this point in time.  We also discussed that as the number of genes included on a panel increases, the chances of variants of uncertain significance increases.  GENETIC TESTING NATIONAL CRITERIA: Based on [employer] personal history of triple negative breast cancer she meets medical criteria for genetic testing based on the [employer] (NCCN) guidelines.   Despite that she meets criteria, she  may still have an out of pocket cost. She completed the [facility] patient assistance form in the office. We discussed that if her out of pocket cost for testing is over $100, the laboratory will send a text with the estimated out-of-pocket cost.  If the out of pocket cost of testing is less than $100 she will be billed by the genetic testing laboratory.   GENETIC TESTING CONSENT:  After considering the risks, benefits, and limitations, Susan Trevino provided informed consent to pursue genetic testing. A blood sample was sent to [facility] for analysis of the [employer] and CancerNext+RNA Panels from [facility] Results should be available within approximately 2-3 weeks' time, at which point they will be disclosed by telephone to Susan Trevino , as will any additional recommendations warranted by these results. Susan Trevino will receive a summary of her genetic counseling visit and a copy of her results once available. This information will also be available in Epic.  BRCAplus: ATM, BARD1, BRCA1, BRCA2, CDH1, CHEK2, NF1, PALB2, PTEN, RAD51C, RAD51D, STK11 and TP53 (sequencing and deletion/duplication) [facility] CancerNext + RNAinsight gene panel which includes sequencing, rearrangement analysis, and RNA analysis for the following 40 genes: APC, ATM, BAP1, BARD1, BMPR1A, BRCA1, BRCA2, BRIP1, CDH1, CDKN2A, CHEK2, FH, FLCN, MET, MLH1, MSH2, MSH6, MUTYH, NF1, NTHL1, PALB2, PMS2, PTEN, RAD51C, RAD51D, RPS20, SMAD4, STK11,  TP53, TSC1, TSC2, and VHL (sequencing and deletion/duplication); AXIN2, HOXB13, MBD4, MSH3, POLD1 and POLE (sequencing only); EPCAM and GREM1 (deletion/duplication only). GENETIC INFORMATION NONDISCRIMINATION ACT (GINA): We discussed that some people do not want to undergo genetic testing due to fear of genetic discrimination.  The Genetic Information Nondiscrimination Act (GINA) was signed into federal law in 2008. GINA prohibits health insurers and most employers from discriminating against individuals based on genetic information (including the results of genetic tests and family history information). According to GINA, health insurance companies cannot consider genetic information to be a preexisting condition, nor can they use it to make decisions regarding coverage or rates. GINA also makes it illegal for most employers to  use genetic information in making decisions about hiring, firing, promotion, or terms of employment. It is important to note that GINA does not offer protections for life insurance, disability insurance, or long-term care insurance. GINA does not apply to those in the [employer], those who work for companies with less than 15 employees, and new life insurance or long-term disability insurance policies.  Health status due to a cancer diagnosis is not protected under GINA. More information about GINA can be found by visiting eliteclients.be. Lastly, we encouraged Susan Trevino to remain in contact with cancer genetics annually so that we can continuously update the family history and inform her of any changes in cancer genetics and testing that may be of benefit for this family.   Susan Trevino questions were answered to her satisfaction today. Our contact information was provided should additional questions or concerns arise. Thank you for the referral and allowing us  to share in the care of your patient.   Resources:  Susan Trevino was provided with the following:  [facility]  Billing information  [facility] Hereditary Cancer Testing Patient Guide [facility] Patient Assistance Information Sheet [facility] CancerNext + RNAinsight gene list  PLAN:  Testing Ordered: Conservation Officer, Nature + RNAinsight gene list  Susan Fryer, MS, CGC  Certified Genetic Counselor  Email: [email or web address]  Phone: [phone]  I personally spent a total of 20 minutes in the care of the patient today including preparing to see the patient, counseling and educating, placing orders, and documenting clinical information in the EHR.  The patient was joined by her maternal aunt. Drs. Lanny Stalls, and/or Gudena were available for questions, if needed. _______________________________________________________________________ For Office Staff:  Number of people involved in session: 2 Was an Intern/ student involved with case: no

## 2024-06-29 ENCOUNTER — Other Ambulatory Visit: Payer: Self-pay | Admitting: Family

## 2024-06-29 DIAGNOSIS — J45909 Unspecified asthma, uncomplicated: Secondary | ICD-10-CM

## 2024-06-30 NOTE — Telephone Encounter (Signed)
 Complete

## 2024-07-04 ENCOUNTER — Inpatient Hospital Stay: Admitting: Licensed Clinical Social Worker

## 2024-07-04 ENCOUNTER — Encounter (HOSPITAL_BASED_OUTPATIENT_CLINIC_OR_DEPARTMENT_OTHER): Payer: Self-pay | Admitting: Surgery

## 2024-07-04 ENCOUNTER — Other Ambulatory Visit: Payer: Self-pay

## 2024-07-04 ENCOUNTER — Other Ambulatory Visit: Payer: Self-pay | Admitting: Surgery

## 2024-07-04 ENCOUNTER — Telehealth: Payer: Self-pay | Admitting: *Deleted

## 2024-07-04 DIAGNOSIS — C50411 Malignant neoplasm of upper-outer quadrant of right female breast: Secondary | ICD-10-CM

## 2024-07-04 DIAGNOSIS — Z853 Personal history of malignant neoplasm of breast: Secondary | ICD-10-CM

## 2024-07-04 NOTE — Telephone Encounter (Signed)
 [employer] 2021-05 - Specimen Collection Study to Evaluate Biomarkers in Subjects with Cancer   Called patient to follow up on the above study to see if she has any questions and if she wants to make appointment for consent and lab draw prior to her surgery. Patient states she is unsure at this time when she can come in for consent/lab due to the weather and her aunt's schedule. Patient is also still unsure when her surgery will be scheduled. Patient's aunt [name] was also interested in participating in this study and would come at the same time as patient.  Patient states she will call this research nurse back after she speaks with her aunt. Provided my phone number to patient and thanked her for her time.  Cherylyn Hoard, BSN, RN, [employer] Research Nurse II [phone] 07/04/2024 11:27 AM

## 2024-07-04 NOTE — Progress Notes (Signed)
 [facility] Clinical Social Work  Initial Assessment   Susan Trevino is a 54 y.o. year old female contacted by phone. Clinical Social Work was referred by medical provider for assessment of psychosocial needs.   SDOH (Social Determinants of Health) assessments performed: Yes SDOH Interventions    Flowsheet Row Office Visit from 04/13/2024 in [facility]  SDOH Interventions   Food Insecurity Interventions Intervention Not Indicated  Housing Interventions Intervention Not Indicated, Patient Declined  [patient lives with her daughter now]  Transportation Interventions Intervention Not Indicated  Alcohol Usage Interventions Intervention Not Indicated (Score <7)  Financial Strain Interventions Intervention Not Indicated  Physical Activity Interventions Intervention Not Indicated  Stress Interventions Intervention Not Indicated  Social Connections Interventions Intervention Not Indicated  Health Literacy Interventions Intervention Not Indicated    SDOH Screenings   Food Insecurity: Food Insecurity Present (06/28/2024)  Housing: Unknown (06/28/2024)   Received from [facility]  Recent Concern: Housing - High Risk (06/28/2024)  Transportation Needs: Unmet Transportation Needs (06/28/2024)  Utilities: Not At Risk (12/28/2022)   Received from [facility]  Alcohol Screen: Low Risk (04/13/2024)  Depression (PHQ2-9): Low Risk (06/28/2024)  Financial Resource Strain: High Risk (04/13/2024)  Physical Activity: Inactive (04/13/2024)  Social Connections: Socially Isolated (04/13/2024)  Stress: No Stress Concern Present (04/13/2024)  Tobacco Use: High Risk (07/04/2024)  Health Literacy: Adequate Health Literacy (04/13/2024)    PHQ 2/9:    06/28/2024    4:19 PM 04/13/2024   10:50 AM  Depression screen PHQ 2/9  Decreased Interest 2 0  Down, Depressed, Hopeless 2 0  PHQ - 2 Score 4 0  Altered sleeping  2  Tired, decreased energy  1  Change in appetite  0   Feeling bad or failure about yourself   0  Trouble concentrating  0  Moving slowly or fidgety/restless  0  Suicidal thoughts  0  PHQ-9 Score  3   Difficult doing work/chores  Somewhat difficult     Data saved with a previous flowsheet row definition     Distress Screen completed: No     No data to display            Family/Social Information:  Housing Arrangement: patient lives with her daughter, son-in-law, and 3 grandchildren who are ages 86, 73 and 84. Family members/support persons in your life? Pt has another daughter, aunt and brother who are able to offer support as needed. Transportation concerns: no, pt's daughter and aunt will take turns providing transportation to appointments.  CSW advised signing up for Medicaid transportation benefits in case she needs a backup plan while undergoing radiation. Employment: Unemployed .  Income source: Special Educational Needs Teacher Income Financial concerns: Yes, current concerns Type of concern: Utilities Food access concerns: yes Religious or spiritual practice: Yes-[employer] Advanced directives: No Services Currently in place:  none  Coping/ Adjustment to diagnosis: Patient understands treatment plan and what happens next? yes Concerns about diagnosis and/or treatment: Overwhelmed by information Patient reported stressors: Finances and Adjusting to my illness Hopes and/or priorities: pt's priority is to start treatment w/ the hope of positive results Patient enjoys time with family/ friends Current coping skills/ strengths: Capable of independent living , Motivation for treatment/growth , Physical Health , and Supportive family/friends     SUMMARY: Current SDOH Barriers:  Financial constraints related to limited income  Clinical Social Work Clinical Goal(s):  Explore community resource options for unmet needs related to:  Financial Strain   Interventions: Discussed  common feeling and emotions when being diagnosed with cancer,  and the importance of support during treatment Informed patient of the support team roles and support services at [facility] Provided CSW contact information and encouraged patient to call with any questions or concerns Pt informed of [employer] and criteria for eligibility.  Pt advised to apply for SNAP benefits.  Pt also informed of the [employer].  Pt will undergo surgery and start treatment 4 to 6 weeks post surgery.  CSW instructed pt to reach out to CSW at start of treatment to apply for grants.  Pt also informed of the food pantry at [facility].   Follow Up Plan: Patient will contact CSW with any support or resource needs Patient verbalizes understanding of plan: Yes    Susan JONELLE Manna, LCSW Clinical Social Worker [facility]  Patient is participating in a Managed Medicaid Plan:  Yes

## 2024-07-06 ENCOUNTER — Telehealth: Payer: Self-pay | Admitting: *Deleted

## 2024-07-06 NOTE — Telephone Encounter (Addendum)
 [employer] 2021-05 - Specimen Collection Study to Evaluate Biomarkers in Subjects with Cancer   Called patient to follow up on the above study. No answer and no voicemail set up.  Will call again later today.  Cherylyn Hoard, BSN, RN, CCRP Clinical Research Nurse II [phone] 07/06/2024 10:39 AM  Called patient again to follow up and see if she and her aunt would like to make appointment tomorrow for research consent and lab draw. Patient states she is not able to come in tomorrow and since her surgery is on Monday, she will have to decline.  Patient states her aunt will be with her tomorrow and also not able to come in to participate in the non cancer cohort. Asked patient to let her aunt know that she can still participate at a later time if she wants and to please call this research nurse if she is still interested in the future.  Patient verbalized understanding and said she would let her aunt know. Thanked patient for her time.  Cherylyn Hoard, BSN, RN, [employer] Research Nurse II [phone] 07/06/2024 4:02 PM

## 2024-07-07 ENCOUNTER — Other Ambulatory Visit: Payer: Self-pay | Admitting: Surgery

## 2024-07-07 ENCOUNTER — Encounter: Payer: Self-pay | Admitting: *Deleted

## 2024-07-07 ENCOUNTER — Ambulatory Visit
Admission: RE | Admit: 2024-07-07 | Discharge: 2024-07-07 | Disposition: A | Source: Ambulatory Visit | Attending: Surgery | Admitting: Surgery

## 2024-07-07 ENCOUNTER — Telehealth: Payer: Self-pay | Admitting: *Deleted

## 2024-07-07 DIAGNOSIS — Z853 Personal history of malignant neoplasm of breast: Secondary | ICD-10-CM

## 2024-07-07 MED ORDER — ENSURE PRE-SURGERY PO LIQD: 296.0000 mL | Freq: Once | ORAL | Status: DC

## 2024-07-07 NOTE — Telephone Encounter (Signed)
 Attempted to call patient to follow up from [facility] 1/21 and assess navigation needs.  Unable to leave VM due to not set up.

## 2024-07-07 NOTE — Progress Notes (Signed)

## 2024-07-09 NOTE — Anesthesia Preprocedure Evaluation (Signed)
"                                    Anesthesia Evaluation  Patient identified by MRN, date of birth, ID band Patient awake    Reviewed: Allergy & Precautions, NPO status , Patient's Chart, lab work & pertinent test results  History of Anesthesia Complications Negative for: history of anesthetic complications  Airway Mallampati: II  TM Distance: >3 FB Neck ROM: Full    Dental  (+) Edentulous Lower, Edentulous Upper   Pulmonary COPD,  COPD inhaler, Current Smoker and Patient abstained from smoking.   Pulmonary exam normal        Cardiovascular negative cardio ROS Normal cardiovascular exam     Neuro/Psych    Depression       GI/Hepatic negative GI ROS, Neg liver ROS,,,  Endo/Other    Class 3 obesity (BMI 47)  Renal/GU negative Renal ROS     Musculoskeletal negative musculoskeletal ROS (+)    Abdominal   Peds  Hematology negative hematology ROS (+)   Anesthesia Other Findings RIGHT BREAST CANCER  Reproductive/Obstetrics                              Anesthesia Physical Anesthesia Plan  ASA: 2  Anesthesia Plan: General   Post-op Pain Management: Tylenol  PO (pre-op)* and Regional block*   Induction: Intravenous  PONV Risk Score and Plan: 3 and Treatment may vary due to age or medical condition, Ondansetron , Dexamethasone  and Midazolam   Airway Management Planned: LMA  Additional Equipment: None  Intra-op Plan:   Post-operative Plan: Extubation in OR  Informed Consent: I have reviewed the patients History and Physical, chart, labs and discussed the procedure including the risks, benefits and alternatives for the proposed anesthesia with the patient or authorized representative who has indicated his/her understanding and acceptance.     Dental advisory given  Plan Discussed with: CRNA  Anesthesia Plan Comments:          Anesthesia Quick Evaluation  "

## 2024-07-10 ENCOUNTER — Encounter (HOSPITAL_BASED_OUTPATIENT_CLINIC_OR_DEPARTMENT_OTHER): Payer: Self-pay | Admitting: Anesthesiology

## 2024-07-10 ENCOUNTER — Encounter (HOSPITAL_BASED_OUTPATIENT_CLINIC_OR_DEPARTMENT_OTHER)
Admission: RE | Disposition: A | Discharge status: Home / Self Care | Payer: Self-pay | Source: Home / Self Care | Attending: Surgery

## 2024-07-10 ENCOUNTER — Ambulatory Visit (HOSPITAL_COMMUNITY)

## 2024-07-10 ENCOUNTER — Ambulatory Visit (HOSPITAL_BASED_OUTPATIENT_CLINIC_OR_DEPARTMENT_OTHER)
Admission: RE | Admit: 2024-07-10 | Discharge: 2024-07-10 | Disposition: A | Discharge status: Home / Self Care | Source: Home / Self Care | Attending: Surgery | Admitting: Surgery

## 2024-07-10 ENCOUNTER — Other Ambulatory Visit: Payer: Self-pay

## 2024-07-10 ENCOUNTER — Encounter (HOSPITAL_BASED_OUTPATIENT_CLINIC_OR_DEPARTMENT_OTHER): Payer: Self-pay | Admitting: Surgery

## 2024-07-10 ENCOUNTER — Ambulatory Visit
Admission: RE | Admit: 2024-07-10 | Discharge: 2024-07-10 | Disposition: A | Discharge status: Home / Self Care | Source: Ambulatory Visit | Attending: Surgery | Admitting: Surgery

## 2024-07-10 DIAGNOSIS — C50911 Malignant neoplasm of unspecified site of right female breast: Secondary | ICD-10-CM | POA: Diagnosis not present

## 2024-07-10 DIAGNOSIS — Z853 Personal history of malignant neoplasm of breast: Secondary | ICD-10-CM

## 2024-07-10 HISTORY — DX: Prediabetes: R73.03

## 2024-07-10 HISTORY — DX: Depression, unspecified: F32.A

## 2024-07-10 MED ORDER — HEPARIN SOD (PORK) LOCK FLUSH 100 UNIT/ML IV SOLN
INTRAVENOUS | Status: AC
  Filled 2024-07-10: qty 5

## 2024-07-10 MED ORDER — OXYCODONE HCL 5 MG/5ML PO SOLN: 5.0000 mg | Freq: Once | ORAL | Status: AC | PRN

## 2024-07-10 MED ORDER — PHENYLEPHRINE HCL-NACL 20-0.9 MG/250ML-% IV SOLN
INTRAVENOUS | Status: DC | PRN
  Administered 2024-07-10: 25 ug/min via INTRAVENOUS

## 2024-07-10 MED ORDER — HEPARIN (PORCINE) IN NACL 1000-0.9 UT/500ML-% IV SOLN
INTRAVENOUS | Status: AC
  Filled 2024-07-10: qty 500

## 2024-07-10 MED ORDER — LIDOCAINE 2% (20 MG/ML) 5 ML SYRINGE
INTRAMUSCULAR | Status: AC
  Filled 2024-07-10: qty 5

## 2024-07-10 MED ORDER — EPHEDRINE SULFATE (PRESSORS) 25 MG/5ML IV SOSY
PREFILLED_SYRINGE | INTRAVENOUS | Status: DC | PRN
  Administered 2024-07-10: 15 mg via INTRAVENOUS
  Administered 2024-07-10: 10 mg via INTRAVENOUS

## 2024-07-10 MED ORDER — ONDANSETRON HCL 4 MG/2ML IJ SOLN
INTRAMUSCULAR | Status: DC | PRN
  Administered 2024-07-10: 4 mg via INTRAVENOUS

## 2024-07-10 MED ORDER — PHENYLEPHRINE 80 MCG/ML (10ML) SYRINGE FOR IV PUSH (FOR BLOOD PRESSURE SUPPORT)
PREFILLED_SYRINGE | INTRAVENOUS | Status: AC
  Filled 2024-07-10: qty 20

## 2024-07-10 MED ORDER — CHLORHEXIDINE GLUCONATE CLOTH 2 % EX PADS: 6.0000 | MEDICATED_PAD | Freq: Once | CUTANEOUS | Status: DC

## 2024-07-10 MED ORDER — DEXMEDETOMIDINE HCL IN NACL 80 MCG/20ML IV SOLN
INTRAVENOUS | Status: AC
  Filled 2024-07-10: qty 40

## 2024-07-10 MED ORDER — OXYCODONE HCL 5 MG PO TABS
5.0000 mg | ORAL_TABLET | Freq: Once | ORAL | Status: AC | PRN
  Administered 2024-07-10: 5 mg via ORAL

## 2024-07-10 MED ORDER — FENTANYL CITRATE (PF) 100 MCG/2ML IJ SOLN
INTRAMUSCULAR | Status: AC
  Filled 2024-07-10: qty 2

## 2024-07-10 MED ORDER — CEFAZOLIN SODIUM-DEXTROSE 2-4 GM/100ML-% IV SOLN
2.0000 g | INTRAVENOUS | Status: AC
  Administered 2024-07-10: 3 g via INTRAVENOUS

## 2024-07-10 MED ORDER — LACTATED RINGERS IV SOLN: INTRAVENOUS | Status: DC

## 2024-07-10 MED ORDER — ACETAMINOPHEN 500 MG PO TABS
1000.0000 mg | ORAL_TABLET | ORAL | Status: AC
  Administered 2024-07-10: 1000 mg via ORAL

## 2024-07-10 MED ORDER — FENTANYL CITRATE (PF) 100 MCG/2ML IJ SOLN
25.0000 ug | INTRAMUSCULAR | Status: DC | PRN
  Administered 2024-07-10 (×2): 50 ug via INTRAVENOUS

## 2024-07-10 MED ORDER — BUPIVACAINE-EPINEPHRINE 0.5% -1:200000 IJ SOLN
INTRAMUSCULAR | Status: DC | PRN
  Administered 2024-07-10: 30 mL

## 2024-07-10 MED ORDER — ONDANSETRON HCL 4 MG/2ML IJ SOLN
INTRAMUSCULAR | Status: AC
  Filled 2024-07-10: qty 2

## 2024-07-10 MED ORDER — OXYCODONE HCL 5 MG PO TABS
ORAL_TABLET | ORAL | Status: AC
  Filled 2024-07-10: qty 1

## 2024-07-10 MED ORDER — PHENYLEPHRINE HCL-NACL 20-0.9 MG/250ML-% IV SOLN: INTRAVENOUS | Status: DC | PRN

## 2024-07-10 MED ORDER — OXYCODONE HCL 5 MG PO TABS: 5.0000 mg | ORAL_TABLET | Freq: Four times a day (QID) | ORAL | 0 refills | Status: DC | PRN

## 2024-07-10 MED ORDER — PROPOFOL 10 MG/ML IV BOLUS
INTRAVENOUS | Status: AC
  Filled 2024-07-10: qty 20

## 2024-07-10 MED ORDER — MIDAZOLAM HCL 2 MG/2ML IJ SOLN
INTRAMUSCULAR | Status: AC
  Filled 2024-07-10: qty 2

## 2024-07-10 MED ORDER — CEFAZOLIN SODIUM-DEXTROSE 3-4 GM/150ML-% IV SOLN
INTRAVENOUS | Status: AC
  Filled 2024-07-10: qty 150

## 2024-07-10 MED ORDER — BUPIVACAINE-EPINEPHRINE (PF) 0.5% -1:200000 IJ SOLN
INTRAMUSCULAR | Status: AC
  Filled 2024-07-10: qty 30

## 2024-07-10 MED ORDER — EPHEDRINE 5 MG/ML INJ
INTRAVENOUS | Status: AC
  Filled 2024-07-10: qty 5

## 2024-07-10 MED ORDER — ACETAMINOPHEN 500 MG PO TABS
ORAL_TABLET | ORAL | Status: AC
  Filled 2024-07-10: qty 2

## 2024-07-10 MED ORDER — HEPARIN SOD (PORK) LOCK FLUSH 100 UNIT/ML IV SOLN
INTRAVENOUS | Status: DC | PRN
  Administered 2024-07-10: 500 [IU] via INTRAVENOUS

## 2024-07-10 MED ORDER — DEXAMETHASONE SOD PHOSPHATE PF 10 MG/ML IJ SOLN
INTRAMUSCULAR | Status: DC | PRN
  Administered 2024-07-10: 5 mg via INTRAVENOUS

## 2024-07-10 MED ORDER — LIDOCAINE 2% (20 MG/ML) 5 ML SYRINGE
INTRAMUSCULAR | Status: DC | PRN
  Administered 2024-07-10: 100 mg via INTRAVENOUS

## 2024-07-10 MED ORDER — OXYCODONE HCL 5 MG PO TABS: 5.0000 mg | ORAL_TABLET | Freq: Four times a day (QID) | ORAL | 0 refills | Status: AC | PRN

## 2024-07-10 MED ORDER — FENTANYL CITRATE (PF) 100 MCG/2ML IJ SOLN
50.0000 ug | Freq: Once | INTRAMUSCULAR | Status: AC
  Administered 2024-07-10: 50 ug via INTRAVENOUS

## 2024-07-10 MED ORDER — PHENYLEPHRINE HCL (PRESSORS) 10 MG/ML IV SOLN
INTRAVENOUS | Status: DC | PRN
  Administered 2024-07-10 (×4): 160 ug via INTRAVENOUS

## 2024-07-10 MED ORDER — HEPARIN (PORCINE) IN NACL 2-0.9 UNITS/ML
INTRAMUSCULAR | Status: AC | PRN
  Administered 2024-07-10: 500 mL

## 2024-07-10 MED ORDER — DEXAMETHASONE SOD PHOSPHATE PF 10 MG/ML IJ SOLN
INTRAMUSCULAR | Status: AC
  Filled 2024-07-10: qty 1

## 2024-07-10 MED ORDER — HYDROMORPHONE HCL 1 MG/ML IJ SOLN
INTRAMUSCULAR | Status: AC
  Filled 2024-07-10: qty 0.5

## 2024-07-10 MED ORDER — PHENYLEPHRINE 80 MCG/ML (10ML) SYRINGE FOR IV PUSH (FOR BLOOD PRESSURE SUPPORT)
PREFILLED_SYRINGE | INTRAVENOUS | Status: AC
  Filled 2024-07-10: qty 10

## 2024-07-10 MED ORDER — MIDAZOLAM HCL (PF) 2 MG/2ML IJ SOLN
1.0000 mg | Freq: Once | INTRAMUSCULAR | Status: AC
  Administered 2024-07-10: 1 mg via INTRAVENOUS

## 2024-07-10 MED ORDER — FENTANYL CITRATE (PF) 100 MCG/2ML IJ SOLN
INTRAMUSCULAR | Status: DC | PRN
  Administered 2024-07-10 (×2): 50 ug via INTRAVENOUS
  Administered 2024-07-10 (×2): 25 ug via INTRAVENOUS
  Administered 2024-07-10: 50 ug via INTRAVENOUS

## 2024-07-10 MED ORDER — PROPOFOL 10 MG/ML IV BOLUS
INTRAVENOUS | Status: DC | PRN
  Administered 2024-07-10: 80 mg via INTRAVENOUS
  Administered 2024-07-10: 50 mg via INTRAVENOUS
  Administered 2024-07-10: 200 mg via INTRAVENOUS

## 2024-07-10 MED ORDER — HYDROMORPHONE HCL 1 MG/ML IJ SOLN
0.2500 mg | INTRAMUSCULAR | Status: DC | PRN
  Administered 2024-07-10 (×2): 0.5 mg via INTRAVENOUS

## 2024-07-10 MED ORDER — BUPIVACAINE-EPINEPHRINE (PF) 0.5% -1:200000 IJ SOLN
INTRAMUSCULAR | Status: DC | PRN
  Administered 2024-07-10: 30 mL via PERINEURAL

## 2024-07-10 MED ORDER — CLONIDINE HCL (ANALGESIA) 100 MCG/ML EP SOLN
EPIDURAL | Status: DC | PRN
  Administered 2024-07-10: 100 ug

## 2024-07-10 MED ORDER — MAGTRACE LYMPHATIC TRACER
INTRAMUSCULAR | Status: DC | PRN
  Administered 2024-07-10: 2 mL via INTRAMUSCULAR

## 2024-07-10 MED ORDER — DROPERIDOL 2.5 MG/ML IJ SOLN: 0.6250 mg | Freq: Once | INTRAMUSCULAR | Status: DC | PRN

## 2024-07-10 NOTE — Progress Notes (Signed)
 Patients oxygen going from 88% to 94% on room air. Dr paul called and stated ok to send home. Patient is using incentive spirometer and has inhaler at home. Will advise patient to check oxygen at home if able.

## 2024-07-10 NOTE — Anesthesia Procedure Notes (Signed)
 Procedure Name: LMA Insertion Date/Time: 07/10/2024 1:52 PM  Performed by: Debarah Chiquita LABOR, CRNAPre-anesthesia Checklist: Patient identified, Emergency Drugs available, Suction available and Patient being monitored Patient Re-evaluated:Patient Re-evaluated prior to induction Oxygen Delivery Method: Circle system utilized Preoxygenation: Pre-oxygenation with 100% oxygen Induction Type: IV induction Ventilation: Mask ventilation without difficulty LMA: LMA inserted LMA Size: 4.0 Number of attempts: 1 Airway Equipment and Method: Bite block Placement Confirmation: positive ETCO2 Tube secured with: Tape Dental Injury: Teeth and Oropharynx as per pre-operative assessment

## 2024-07-10 NOTE — Op Note (Signed)
 "  Susan Trevino 07/10/2024   Pre-op Diagnosis: RIGHT BREAST CANCER     Post-op Diagnosis: SAME  Procedures: RADIOACTIVE SEED GUIDED RIGHT BREAST LUMPECTOMY DEEP AXILLARY SENTINEL LYMPH NODE BIOPSY INJECTION OF MAG TRACE FOR LYMPH NODE MAPPING UNSUCCESSFUL ATTEMPT AT PORT-A-CATH INSERTION  Surgeon(s): Vernetta Berg, MD  Anesthesia: General  Staff:  Circulator: Elaine Avelina PARAS, RN Radiology Technologist: Karolee Powell NOVAK, RTR Scrub Person: Bridgett Waddell PARAS, RN  Estimated Blood Loss: Minimal               Specimens: sent to path  Indications: This is a 54 year old female who mammography was found to have a 1.1 cm mass at the 9 o'clock position of the right breast.  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  After discussion in multidisciplinary breast cancer conference, the decision was made to proceed with a radioactive seed guided right breast lumpectomy with sentinel node biopsy and Port-A-Cath insertion for intravenous chemotherapy  Findings: The patient was morbidly obese with a large,  short neck.  She could not get adequately positioned on the table and could not turn her head adequately and I did not feel comfortable attempting an ultrasound-guided internal jugular port insertion.  I attempted unsuccessfully to even cannulate her left subclavian vein with the patient in the Trendelenburg position so the attempt at the Port-A-Cath was aborted.  Procedure: The patient was brought to the operating room identified the correct patient.  She was placed on the operating table and general anesthesia was induced.  I first injected mag trace underneath the right nipple areolar complex under sterile technique and massaged the breast.  Her left chest and neck were then prepped and draped in usual sterile fashion.  I anesthetized the left chest with Marcaine .  With the patient in the Trendelenburg  position I attempted to cannulate the left subclavian vein but was unsuccessful in doing so with introducer needle.  Again I believe this was secondary to her morbid obesity.  At this point I decided to stop any further attempts at insertion of a port.  The patient was then reprepped and draped prepping out the right breast and axilla.  Using the neoprobe identified an area of increased uptake in the lower outer quadrant of the right breast.  I anesthetized the area with Marcaine  and then made a longitudinal incision with a scalpel.  I then dissected circumferentially down to the breast tissue with electrocautery.  With the aid of the neoprobe I then dissected down to the deep breast tissue locating the radioactive seed.  With the aid of the neoprobe I stayed widely around the signal using electrocautery to dissect medial down to the chest wall and then superior and inferiorly and then finally laterally completing the lumpectomy.  Once I removed the lumpectomy specimen I marked all margins with paint.  An x-ray was performed on the specimen confirming the radioactive seed and previous biopsy clip in the center of the specimen.  The specimen was then sent to pathology for evaluation.  I then achieved hemostasis in the lumpectomy site with the cautery  Using the mag trace probe I then identified an area of increased uptake in the right axilla.  I anesthetized the skin with Marcaine  and made a longitudinal incision with scalpel.  I then dissected down into the deep left axillary tissue.  I was able to identify several enlarged lymph nodes with  uptake of the mag trace.  There were several friable lymph nodes and fat in this area which I excised as well.  The lymph nodes were then sent to pathology for evaluation.  I achieved hemostasis with several surgical clips and the cautery in the axilla.  No other enlarged lymph nodes and no further increased uptake of mag trace was identified.  I explained surgical clips around  the periphery of the lumpectomy cavity.  I then closed both incisions with interrupted 3-0 Vicryl sutures and running 4-0 Monocryl sutures.  Dermabond was then applied.  The patient tolerated the procedure well.  All the counts were correct at the end of the procedure.  The patient was then extubated in the operating room and taken in stable condition to the recovery room.          Vicenta Poli   Date: 07/10/2024  Time: 3:07 PM    "

## 2024-07-10 NOTE — Progress Notes (Signed)
Assisted Dr. Howze with right, pectoralis, ultrasound guided block. Side rails up, monitors on throughout procedure. See vital signs in flow sheet. Tolerated Procedure well. 

## 2024-07-10 NOTE — Interval H&P Note (Signed)
 History and Physical Interval Note:no change in H and P  07/10/2024 10:51 AM  Susan Trevino  has presented today for surgery, with the diagnosis of RIGHT BREAST CANCER.  The various methods of treatment have been discussed with the patient and family. After consideration of risks, benefits and other options for treatment, the patient has consented to  Procedures with comments: BREAST LUMPECTOMY WITH RADIOACTIVE SEED AND SENTINEL LYMPH NODE BIOPSY (Right) INSERTION, TUNNELED CENTRAL VENOUS DEVICE, WITH PORT (N/A) - PORT PLACEMENT WITH ULTRASOUND GUIDANCE as a surgical intervention.  The patient's history has been reviewed, patient examined, no change in status, stable for surgery.  I have reviewed the patient's chart and labs.  Questions were answered to the patient's satisfaction.     Vicenta Poli

## 2024-07-10 NOTE — Transfer of Care (Signed)
 Immediate Anesthesia Transfer of Care Note  Patient: Susan Trevino  Procedure(s) Performed: BREAST LUMPECTOMY WITH RADIOACTIVE SEED AND SENTINEL LYMPH NODE BIOPSY (Right: Breast) PORTACATH INSERTION ATTEMPTED (Left: Chest)  Patient Location: PACU  Anesthesia Type:GA combined with regional for post-op pain  Level of Consciousness: awake, alert , and oriented  Airway & Oxygen Therapy: Patient Spontanous Breathing and Patient connected to face mask oxygen  Post-op Assessment: Report given to RN and Post -op Vital signs reviewed and stable  Post vital signs: Reviewed and stable  Last Vitals:  Vitals Value Taken Time  BP 149/114 (125)-Dr. Howze aware, patient having 8/10 pain   Temp    Pulse 84 07/10/24 15:15  Resp 14 07/10/24 15:15  SpO2 95 % 07/10/24 15:15  Vitals shown include unfiled device data.  Last Pain:  Vitals:   07/10/24 1103  TempSrc: Temporal  PainSc: 4          Complications: No notable events documented.

## 2024-07-11 ENCOUNTER — Other Ambulatory Visit: Payer: Self-pay | Admitting: *Deleted

## 2024-07-11 ENCOUNTER — Encounter (HOSPITAL_BASED_OUTPATIENT_CLINIC_OR_DEPARTMENT_OTHER): Payer: Self-pay | Admitting: Surgery

## 2024-07-11 ENCOUNTER — Telehealth: Payer: Self-pay

## 2024-07-11 DIAGNOSIS — Z171 Estrogen receptor negative status [ER-]: Secondary | ICD-10-CM

## 2024-07-11 NOTE — Anesthesia Postprocedure Evaluation (Signed)
"   Anesthesia Post Note  Patient: Susan Trevino  Procedure(s) Performed: BREAST LUMPECTOMY WITH RADIOACTIVE SEED AND SENTINEL LYMPH NODE BIOPSY (Right: Breast) PORTACATH INSERTION ATTEMPTED (Left: Chest)     Patient location during evaluation: PACU Anesthesia Type: General Level of consciousness: awake and alert Pain management: pain level controlled Vital Signs Assessment: post-procedure vital signs reviewed and stable Respiratory status: spontaneous breathing, nonlabored ventilation and respiratory function stable Cardiovascular status: blood pressure returned to baseline Postop Assessment: no apparent nausea or vomiting Anesthetic complications: no   No notable events documented.  Last Vitals:  Vitals:   07/10/24 1548 07/10/24 1639  BP: 138/64 137/78  Pulse: 77 88  Resp: 15 18  Temp:    SpO2: 95% 94%            Vertell Row      "

## 2024-07-14 ENCOUNTER — Ambulatory Visit: Payer: Self-pay | Admitting: Family

## 2024-07-14 LAB — SURGICAL PATHOLOGY

## 2024-07-20 ENCOUNTER — Ambulatory Visit (HOSPITAL_COMMUNITY)

## 2024-07-20 ENCOUNTER — Other Ambulatory Visit (HOSPITAL_COMMUNITY)

## 2024-07-24 ENCOUNTER — Inpatient Hospital Stay

## 2024-07-24 ENCOUNTER — Inpatient Hospital Stay: Admitting: Pharmacist

## 2024-07-24 ENCOUNTER — Inpatient Hospital Stay: Attending: Hematology | Admitting: Hematology
